# Patient Record
Sex: Female | Born: 1965
Health system: Southern US, Community
[De-identification: ages and names within clinical notes are randomized; demographics above are authoritative.]

## PROBLEM LIST (undated history)

## (undated) DIAGNOSIS — E785 Hyperlipidemia, unspecified: Secondary | ICD-10-CM

## (undated) DIAGNOSIS — F32A Depression, unspecified: Secondary | ICD-10-CM

## (undated) DIAGNOSIS — I10 Essential (primary) hypertension: Secondary | ICD-10-CM

## (undated) DIAGNOSIS — F419 Anxiety disorder, unspecified: Secondary | ICD-10-CM

## (undated) DIAGNOSIS — F329 Major depressive disorder, single episode, unspecified: Secondary | ICD-10-CM

## (undated) HISTORY — DX: Essential (primary) hypertension: I10

## (undated) HISTORY — DX: Hyperlipidemia, unspecified: E78.5

## (undated) HISTORY — PX: PATENT DUCTUS ARTERIOUS REPAIR: SHX269

## (undated) HISTORY — PX: MASTECTOMY: SHX3

## (undated) HISTORY — PX: APPENDECTOMY: SHX54

## (undated) HISTORY — PX: COLON RESECTION: SHX5231

## (undated) HISTORY — PX: CHOLECYSTECTOMY: SHX55

---
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
## 1993-07-26 HISTORY — PX: COLON SURGERY: SHX602

## 1999-01-15 ENCOUNTER — Other Ambulatory Visit: Admission: RE | Admit: 1999-01-15 | Discharge: 1999-01-15 | Payer: Self-pay | Admitting: Gynecology

## 2001-01-08 ENCOUNTER — Emergency Department (HOSPITAL_COMMUNITY): Admission: EM | Admit: 2001-01-08 | Discharge: 2001-01-08 | Payer: Self-pay

## 2001-01-08 ENCOUNTER — Encounter: Payer: Self-pay | Admitting: Emergency Medicine

## 2001-11-14 ENCOUNTER — Other Ambulatory Visit: Admission: RE | Admit: 2001-11-14 | Discharge: 2001-11-14 | Payer: Self-pay | Admitting: Obstetrics and Gynecology

## 2003-08-13 ENCOUNTER — Other Ambulatory Visit: Admission: RE | Admit: 2003-08-13 | Discharge: 2003-08-13 | Payer: Self-pay | Admitting: Gynecology

## 2003-11-28 ENCOUNTER — Emergency Department (HOSPITAL_COMMUNITY): Admission: EM | Admit: 2003-11-28 | Discharge: 2003-11-29 | Payer: Self-pay | Admitting: Emergency Medicine

## 2005-09-09 ENCOUNTER — Other Ambulatory Visit: Admission: RE | Admit: 2005-09-09 | Discharge: 2005-09-09 | Payer: Self-pay | Admitting: Gynecology

## 2007-01-26 ENCOUNTER — Inpatient Hospital Stay (HOSPITAL_COMMUNITY): Admission: AD | Admit: 2007-01-26 | Discharge: 2007-01-30 | Payer: Self-pay | Admitting: Obstetrics and Gynecology

## 2007-12-25 ENCOUNTER — Emergency Department (HOSPITAL_COMMUNITY): Admission: EM | Admit: 2007-12-25 | Discharge: 2007-12-25 | Payer: Self-pay | Admitting: Emergency Medicine

## 2010-04-23 ENCOUNTER — Inpatient Hospital Stay (HOSPITAL_COMMUNITY): Admission: EM | Admit: 2010-04-23 | Discharge: 2010-04-27 | Payer: Self-pay | Admitting: Emergency Medicine

## 2010-04-24 ENCOUNTER — Encounter (INDEPENDENT_AMBULATORY_CARE_PROVIDER_SITE_OTHER): Payer: Self-pay

## 2010-10-07 LAB — CBC
HCT: 34.1 % — ABNORMAL LOW (ref 36.0–46.0)
Hemoglobin: 10.2 g/dL — ABNORMAL LOW (ref 12.0–15.0)
Hemoglobin: 11.1 g/dL — ABNORMAL LOW (ref 12.0–15.0)
MCV: 99.1 fL (ref 78.0–100.0)
MCV: 99.7 fL (ref 78.0–100.0)
Platelets: 254 10*3/uL (ref 150–400)
RBC: 3.18 MIL/uL — ABNORMAL LOW (ref 3.87–5.11)
RBC: 3.44 MIL/uL — ABNORMAL LOW (ref 3.87–5.11)
WBC: 12.2 10*3/uL — ABNORMAL HIGH (ref 4.0–10.5)
WBC: 9.2 10*3/uL (ref 4.0–10.5)

## 2010-10-07 LAB — COMPREHENSIVE METABOLIC PANEL
ALT: 299 U/L — ABNORMAL HIGH (ref 0–35)
ALT: 464 U/L — ABNORMAL HIGH (ref 0–35)
AST: 138 U/L — ABNORMAL HIGH (ref 0–37)
Albumin: 3 g/dL — ABNORMAL LOW (ref 3.5–5.2)
Alkaline Phosphatase: 111 U/L (ref 39–117)
Alkaline Phosphatase: 95 U/L (ref 39–117)
BUN: <1 mg/dL — ABNORMAL LOW (ref 6–23)
CO2: 30 mEq/L (ref 19–32)
CO2: 33 mEq/L — ABNORMAL HIGH (ref 19–32)
Chloride: 98 mEq/L (ref 96–112)
Creatinine, Ser: 0.62 mg/dL (ref 0.4–1.2)
GFR calc non Af Amer: >60 mL/min (ref >60)
Glucose, Bld: 131 mg/dL — ABNORMAL HIGH (ref 70–99)
Potassium: 3.2 mEq/L — ABNORMAL LOW (ref 3.5–5.1)
Potassium: 3.5 mEq/L (ref 3.5–5.1)
Sodium: 136 mEq/L (ref 135–145)
Total Bilirubin: 0.7 mg/dL (ref 0.3–1.2)
Total Bilirubin: 0.9 mg/dL (ref 0.3–1.2)
Total Protein: 6.7 g/dL (ref 6.0–8.3)

## 2010-10-07 LAB — URINE CULTURE
Colony Count: NO GROWTH
Culture  Setup Time: 201110011737
Culture: NO GROWTH

## 2010-10-07 LAB — URINALYSIS, MICROSCOPIC ONLY
Glucose, UA: NEGATIVE mg/dL
Nitrite: NEGATIVE
Protein, ur: NEGATIVE mg/dL
Specific Gravity, Urine: 1.002 — ABNORMAL LOW (ref 1.005–1.030)
Urobilinogen, UA: 0.2 mg/dL (ref 0.0–1.0)
pH: 6 (ref 5.0–8.0)

## 2010-10-08 LAB — BASIC METABOLIC PANEL
Calcium: 8.3 mg/dL — ABNORMAL LOW (ref 8.4–10.5)
Chloride: 101 mEq/L (ref 96–112)
GFR calc non Af Amer: >60 mL/min (ref >60)
Sodium: 139 mEq/L (ref 135–145)

## 2010-10-08 LAB — DIFFERENTIAL
Eosinophils Absolute: 0 10*3/uL (ref 0.0–0.7)
Monocytes Absolute: 0.7 10*3/uL (ref 0.1–1.0)
Monocytes Relative: 5 % (ref 3–12)
Neutro Abs: 11.4 10*3/uL — ABNORMAL HIGH (ref 1.7–7.7)
Neutrophils Relative %: 87 % — ABNORMAL HIGH (ref 43–77)

## 2010-10-08 LAB — URINALYSIS, ROUTINE W REFLEX MICROSCOPIC
Hgb urine dipstick: NEGATIVE
Nitrite: NEGATIVE
pH: 7.5 (ref 5.0–8.0)

## 2010-10-08 LAB — POCT PREGNANCY, URINE: Preg Test, Ur: NEGATIVE

## 2010-10-08 LAB — HEPATIC FUNCTION PANEL
ALT: 864 U/L — ABNORMAL HIGH (ref 0–35)
Albumin: 3.5 g/dL (ref 3.5–5.2)
Bilirubin, Direct: 0.7 mg/dL — ABNORMAL HIGH (ref 0.0–0.3)
Indirect Bilirubin: 1.3 mg/dL — ABNORMAL HIGH (ref 0.3–0.9)
Total Bilirubin: 2 mg/dL — ABNORMAL HIGH (ref 0.3–1.2)

## 2010-10-08 LAB — URINE MICROSCOPIC-ADD ON

## 2010-10-08 LAB — CBC
Platelets: 307 10*3/uL (ref 150–400)
Platelets: 311 10*3/uL (ref 150–400)
RDW: 12.6 % (ref 11.5–15.5)
WBC: 11.1 10*3/uL — ABNORMAL HIGH (ref 4.0–10.5)

## 2010-10-08 LAB — LIPASE, BLOOD: Lipase: 33 U/L (ref 11–59)

## 2010-10-08 LAB — COMPREHENSIVE METABOLIC PANEL
ALT: 689 U/L — ABNORMAL HIGH (ref 0–35)
Alkaline Phosphatase: 118 U/L — ABNORMAL HIGH (ref 39–117)
BUN: 11 mg/dL (ref 6–23)
CO2: 27 mEq/L (ref 19–32)
GFR calc Af Amer: >60 mL/min (ref >60)
GFR calc non Af Amer: >60 mL/min (ref >60)
Potassium: 3.7 mEq/L (ref 3.5–5.1)
Sodium: 137 mEq/L (ref 135–145)
Total Protein: 7.6 g/dL (ref 6.0–8.3)

## 2010-10-08 LAB — GLUCOSE, CAPILLARY

## 2011-05-07 ENCOUNTER — Encounter (INDEPENDENT_AMBULATORY_CARE_PROVIDER_SITE_OTHER): Payer: Self-pay | Admitting: Surgery

## 2011-05-07 ENCOUNTER — Other Ambulatory Visit (INDEPENDENT_AMBULATORY_CARE_PROVIDER_SITE_OTHER): Payer: Self-pay | Admitting: General Surgery

## 2011-05-07 ENCOUNTER — Ambulatory Visit (INDEPENDENT_AMBULATORY_CARE_PROVIDER_SITE_OTHER): Payer: BC Managed Care – PPO | Admitting: Surgery

## 2011-05-07 DIAGNOSIS — K529 Noninfective gastroenteritis and colitis, unspecified: Secondary | ICD-10-CM

## 2011-05-07 DIAGNOSIS — R109 Unspecified abdominal pain: Secondary | ICD-10-CM

## 2011-05-07 NOTE — Progress Notes (Signed)
Carla Torres had a right hemicolectomy by Joanne Gavel several years ago for benign inflammation of the ileocecal region. It was not clear whether this was appendicitis but she said that he was not inflammatory bowel disease. She was seen a [place] last year when Dr. Gaynelle Adu did a open cholecystectomy on her with cholangiogram. This week she began having some abdominal pain and was seen by Herb Grays who ordered a CT scan at [facility]. This revealed a short segment of small bowel in the right lower quadrant with wall thickening and peri-mesenteric stranding. She was placed on Cipro and Flagyl. Today her pain is not that bad and exam is not remarkable.  It sounds like she may have an inflammatory bowel condition and needs to be seen by a gastroenterologist for colonoscopy and biopsy. We will make an appointment with [facility] to see her regarding this.  In addition as noted a smooth margin a left lung base nodule and followup CT scan with contrast is recommended. This is noted on Dr. Alda Berthold copy and  will defer to her on that.  Plan refer to [facility] for colonoscopy to look for evidence of inflammatory bowel disease.

## 2011-05-11 LAB — COMPREHENSIVE METABOLIC PANEL
ALT: 23
AST: 22
Albumin: 2.4 — ABNORMAL LOW
Alkaline Phosphatase: 110
CO2: 26
Calcium: 8.7
Creatinine, Ser: 0.49
GFR calc Af Amer: >60
GFR calc non Af Amer: >60
Potassium: 3.4 — ABNORMAL LOW
Sodium: 139
Total Protein: 6
Total Protein: 6.4

## 2011-05-11 LAB — CBC
Hemoglobin: 10 — ABNORMAL LOW
MCHC: 33.2
MCV: 90.1
Platelets: 311
Platelets: 340
Platelets: 373
RBC: 4.07
RDW: 13.1
RDW: 13.2
RDW: 13.3

## 2011-05-11 LAB — URINALYSIS, ROUTINE W REFLEX MICROSCOPIC
Bilirubin Urine: NEGATIVE
Glucose, UA: NEGATIVE
Leukocytes, UA: NEGATIVE
Nitrite: NEGATIVE
Specific Gravity, Urine: 1.02
Specific Gravity, Urine: <1.005 — ABNORMAL LOW
Urobilinogen, UA: 0.2
pH: 6

## 2011-05-11 LAB — URINE MICROSCOPIC-ADD ON: Crystals: NONE SEEN — AB

## 2011-05-11 LAB — RPR: RPR Ser Ql: NONREACTIVE

## 2011-05-11 LAB — DIFFERENTIAL
Basophils Relative: 0
Eosinophils Absolute: 0.1
Eosinophils Relative: 0
Metamyelocytes Relative: 0
Monocytes Absolute: 0.3
Monocytes Relative: 3
Monocytes Relative: 6
Neutro Abs: 8.3 — ABNORMAL HIGH
Neutrophils Relative %: 81 — ABNORMAL HIGH
nRBC: 0

## 2012-11-20 ENCOUNTER — Other Ambulatory Visit: Payer: Self-pay | Admitting: Gynecology

## 2014-01-07 ENCOUNTER — Other Ambulatory Visit: Payer: Self-pay | Admitting: Gynecology

## 2014-01-08 LAB — CYTOLOGY - PAP

## 2015-09-15 ENCOUNTER — Encounter (HOSPITAL_COMMUNITY): Payer: Self-pay | Admitting: Emergency Medicine

## 2015-09-15 ENCOUNTER — Emergency Department (HOSPITAL_COMMUNITY)
Admission: EM | Admit: 2015-09-15 | Discharge: 2015-09-16 | Disposition: A | Discharge status: Other Acute Inpatient Hospital | Payer: Self-pay

## 2015-09-15 DIAGNOSIS — R45850 Homicidal ideations: Secondary | ICD-10-CM | POA: Insufficient documentation

## 2015-09-15 DIAGNOSIS — R45851 Suicidal ideations: Secondary | ICD-10-CM | POA: Insufficient documentation

## 2015-09-15 DIAGNOSIS — R4781 Slurred speech: Secondary | ICD-10-CM | POA: Insufficient documentation

## 2015-09-15 DIAGNOSIS — F329 Major depressive disorder, single episode, unspecified: Secondary | ICD-10-CM | POA: Insufficient documentation

## 2015-09-15 DIAGNOSIS — T424X2A Poisoning by benzodiazepines, intentional self-harm, initial encounter: Secondary | ICD-10-CM | POA: Insufficient documentation

## 2015-09-15 DIAGNOSIS — F919 Conduct disorder, unspecified: Secondary | ICD-10-CM | POA: Insufficient documentation

## 2015-09-15 DIAGNOSIS — I10 Essential (primary) hypertension: Secondary | ICD-10-CM | POA: Insufficient documentation

## 2015-09-15 DIAGNOSIS — F419 Anxiety disorder, unspecified: Secondary | ICD-10-CM | POA: Insufficient documentation

## 2015-09-15 DIAGNOSIS — X58XXXA Exposure to other specified factors, initial encounter: Secondary | ICD-10-CM | POA: Insufficient documentation

## 2015-09-15 DIAGNOSIS — Y998 Other external cause status: Secondary | ICD-10-CM | POA: Insufficient documentation

## 2015-09-15 DIAGNOSIS — R451 Restlessness and agitation: Secondary | ICD-10-CM | POA: Insufficient documentation

## 2015-09-15 DIAGNOSIS — F17210 Nicotine dependence, cigarettes, uncomplicated: Secondary | ICD-10-CM | POA: Insufficient documentation

## 2015-09-15 DIAGNOSIS — Y9289 Other specified places as the place of occurrence of the external cause: Secondary | ICD-10-CM | POA: Insufficient documentation

## 2015-09-15 DIAGNOSIS — Y9389 Activity, other specified: Secondary | ICD-10-CM | POA: Insufficient documentation

## 2015-09-15 DIAGNOSIS — Z8639 Personal history of other endocrine, nutritional and metabolic disease: Secondary | ICD-10-CM | POA: Insufficient documentation

## 2015-09-15 HISTORY — DX: Anxiety disorder, unspecified: F41.9

## 2015-09-15 HISTORY — DX: Major depressive disorder, single episode, unspecified: F32.9

## 2015-09-15 HISTORY — DX: Depression, unspecified: F32.A

## 2015-09-15 LAB — COMPREHENSIVE METABOLIC PANEL
ALT: 31 U/L (ref 14–54)
ANION GAP: 9 (ref 5–15)
AST: 23 U/L (ref 15–41)
Albumin: 4.1 g/dL (ref 3.5–5.0)
Alkaline Phosphatase: 76 U/L (ref 38–126)
BUN: 12 mg/dL (ref 6–20)
CHLORIDE: 103 mmol/L (ref 101–111)
CO2: 26 mmol/L (ref 22–32)
CREATININE: 0.75 mg/dL (ref 0.44–1.00)
Calcium: 8.9 mg/dL (ref 8.9–10.3)
Glucose, Bld: 112 mg/dL — ABNORMAL HIGH (ref 65–99)
POTASSIUM: 3.8 mmol/L (ref 3.5–5.1)
SODIUM: 138 mmol/L (ref 135–145)
Total Bilirubin: 0.4 mg/dL (ref 0.3–1.2)
Total Protein: 7.9 g/dL (ref 6.5–8.1)

## 2015-09-15 LAB — CBC WITH DIFFERENTIAL/PLATELET
BASOS ABS: 0 10*3/uL (ref 0.0–0.1)
BASOS PCT: 0 %
EOS ABS: 0.4 10*3/uL (ref 0.0–0.7)
Eosinophils Relative: 4 %
HCT: 42.3 % (ref 36.0–46.0)
HEMOGLOBIN: 14.4 g/dL (ref 12.0–15.0)
Lymphocytes Relative: 30 %
Lymphs Abs: 3 10*3/uL (ref 0.7–4.0)
MCH: 32.5 pg (ref 26.0–34.0)
MCHC: 34 g/dL (ref 30.0–36.0)
MCV: 95.5 fL (ref 78.0–100.0)
MONOS PCT: 7 %
Monocytes Absolute: 0.7 10*3/uL (ref 0.1–1.0)
NEUTROS ABS: 5.8 10*3/uL (ref 1.7–7.7)
NEUTROS PCT: 59 %
Platelets: 285 10*3/uL (ref 150–400)
RBC: 4.43 MIL/uL (ref 3.87–5.11)
RDW: 12.9 % (ref 11.5–15.5)
WBC: 10 10*3/uL (ref 4.0–10.5)

## 2015-09-15 LAB — SALICYLATE LEVEL

## 2015-09-15 LAB — ETHANOL

## 2015-09-15 LAB — ACETAMINOPHEN LEVEL

## 2015-09-15 NOTE — ED Notes (Signed)
Patient ambulatory to restroom with assisance, clean catch instructions given and advised pt to bring specimen back to room as well.

## 2015-09-15 NOTE — ED Notes (Signed)
MD speaking with husband, husband states pt will make make comment in front of 49 year old daughter that she will end this. Daughter was present tonight when husband was taking pills from pt mouth.

## 2015-09-15 NOTE — ED Notes (Signed)
Assumed care of patient at this time. Sitter at the bedside at this time. Labs and urine ordered. Pt changed into paper scrubs. Belongings searched and place in belongings bag and labels and secured in nursing station. 

## 2015-09-15 NOTE — ED Notes (Signed)
Husband states he did a finger sweep and removed 20 pills from pt mouth when at home. Pt told husband she swollen 20 pills.

## 2015-09-15 NOTE — ED Provider Notes (Addendum)
CSN: [id number]     Arrival date & time 09/15/15  2227 History  By signing my name below, I, Carla Torres, attest that this documentation has been prepared under the direction and in the presence of Carla Albe, MD at 2305. Electronically Signed: Linus Torres, ED Scribe. 09/16/2015. 11:05 PM.   Chief Complaint  Patient presents with  . Drug Overdose   The history is provided by the patient. No language interpreter was used.   HPI Comments: Carla Torres is a 50 y.o. female with a PMHx of HTN, HLD, depression and anxiety who presents to the Emergency Department for an evaluation of a possible drug overdose, PTA.[name] Pt reports she is in "withdrawal" after she stopped taking her antidepressan for the past 4 days. Pt reports she feels as if "shock waves are going through her brain".  Pt stopped taking "all her medication because she thinks she is better with out them. Pt states she took 20 Klonopin prescribed by Dr. Yehuda Budd at 8:00 PM, 3 hours PTA to "go to sleep and take away the pain". She states her Husband caught pt taking the pills and called 911. Pt denies any previous psychiatric admissions. Pt last saw a psychiatrist "many years ago". Pt denies any previous suicidal attempts.  Patient states that she has been crying and she can't deal with anything. She states this morning and tonight she had extreme episodes where she was screaming.  As per husband, pt has been trying to stop antidepressants for the past 5 days She has been trying to wean off of xanax, blood pressure medication and several other medications. Husband reports that pt has had suicidal ideations in the past. He reports that she has threatened to kill him and herself and threatened to leave in the car tonight. She has made statements like "I can't take it anymore" and "I am going to end it" to her 53 year old daughter. Pt lost her job 3 years ago and she can't seem to get another job or get over losing that job. Husband states that he and  the pt are separated but the pt rents a room from his since she has no where else to go.  He said he was going to go to the magistrate's office this morning to fill out commitment papers on her however her mother would not allow him to.  PCP None  Past Medical History  Diagnosis Date  . Hypertension   . Hyperlipidemia   . Depression   . Anxiety    Past Surgical History  Procedure Laterality Date  . Exploration post operative open heart  1969  . Cholecystectomy    . Colon surgery  1995   No family history on file. Social History  Substance Use Topics  . Smoking status: Current Every Day Smoker -- 0.25 packs/day    Types: Cigarettes  . Smokeless tobacco: None  . Alcohol Use: Yes     Comment: 2x week  smokes 1/2 ppd Unemployed Lives with spouse but they are separated Lives with 14 yo daughter  OB History    No data available     Review of Systems  Constitutional: Negative for fever and chills.  HENT: Negative for congestion and rhinorrhea.   Eyes: Negative for redness and visual disturbance.  Respiratory: Negative for shortness of breath and wheezing.   Cardiovascular: Negative for chest pain and palpitations.  Gastrointestinal: Negative for nausea and vomiting.  Genitourinary: Negative for dysuria and urgency.  Musculoskeletal: Negative for myalgias  and arthralgias.  Skin: Negative for pallor and wound.  Neurological: Negative for dizziness and headaches.  Psychiatric/Behavioral: Positive for suicidal ideas, behavioral problems, self-injury and agitation.  All other systems reviewed and are negative.  Allergies  Codeine  Home Medications   Prior to Admission medications   Medication Sig Start Date End Date Taking? Authorizing Provider  spironolactone (ALDACTONE) 25 MG tablet  04/09/11  Yes Historical Provider, MD  cyclobenzaprine (FLEXERIL) 10 MG tablet  02/01/11   Historical Provider, MD  fluconazole (DIFLUCAN) 150 MG tablet  05/04/11   Historical Provider, MD   FLUoxetine (PROZAC) 20 MG capsule  05/06/11   Historical Provider, MD  fluticasone Aleda Grana) 50 MCG/ACT nasal spray  04/16/11   Historical Provider, MD  metroNIDAZOLE (FLAGYL) 500 MG tablet  05/04/11   Historical Provider, MD  ondansetron (ZOFRAN) 8 MG tablet  05/04/11   Historical Provider, MD   BP 123/79 mmHg  Pulse 76  Temp(Src) 98.2 F (36.8 C) (Oral)  Resp 18  Ht 5\' 5"  (1.651 m)  Wt 175 lb (79.379 kg)  BMI 29.12 kg/m2  SpO2 95%  LMP 08/15/2015  Vital signs normal     Physical Exam  Constitutional: She is oriented to person, place, and time. She appears well-developed and well-nourished.  Non-toxic appearance. She does not appear ill. No distress.  HENT:  Head: Normocephalic and atraumatic.  Right Ear: External ear normal.  Left Ear: External ear normal.  Nose: Nose normal. No mucosal edema or rhinorrhea.  Mouth/Throat: Oropharynx is clear and moist and mucous membranes are normal. No dental abscesses or uvula swelling.  Eyes: Conjunctivae and EOM are normal. Pupils are equal, round, and reactive to light.  Neck: Normal range of motion and full passive range of motion without pain. Neck supple.  Cardiovascular: Normal rate, regular rhythm and normal heart sounds.  Exam reveals no gallop and no friction rub.   No murmur heard. Pulmonary/Chest: Effort normal and breath sounds normal. No respiratory distress. She has no wheezes. She has no rhonchi. She has no rales. She exhibits no tenderness and no crepitus.  Abdominal: Soft. Normal appearance and bowel sounds are normal. She exhibits no distension. There is no tenderness. There is no rebound and no guarding.  Musculoskeletal: Normal range of motion. She exhibits no edema or tenderness.  Moves all extremities well.   Neurological: She is alert and oriented to person, place, and time. She has normal strength. No cranial nerve deficit.  Skin: Skin is warm, dry and intact. No rash noted. No erythema. No pallor.  Psychiatric: Her  affect is angry and labile. She is aggressive.  Speech is slightly slurred  Nursing note and vitals reviewed.   ED Course  Procedures   Medications  nicotine (NICODERM CQ - dosed in mg/24 hours) patch 21 mg (not administered)  LORazepam (ATIVAN) tablet 1 mg (1 mg Oral Given 09/16/15 0118)  acetaminophen (TYLENOL) tablet 650 mg (not administered)  ibuprofen (ADVIL,MOTRIN) tablet 600 mg (not administered)  zolpidem (AMBIEN) tablet 10 mg (10 mg Oral Given 09/16/15 0118)  nicotine (NICODERM CQ - dosed in mg/24 hours) patch 21 mg (21 mg Transdermal Patch Applied 09/16/15 0118)  ondansetron (ZOFRAN) tablet 4 mg (not administered)  alum & mag hydroxide-simeth (MAALOX/MYLANTA) 200-200-20 MG/5ML suspension 30 mL (not administered)     DIAGNOSTIC STUDIES: Oxygen Saturation is 95% on room air, normal by my interpretation.    COORDINATION OF CARE: 11:05 PM Will order blood work, urinalysis and drug screen. Discussed treatment plan with pt at  bedside and pt agreed to plan. I have explained to the patient that as long as she stays and is cooperative that she can stay voluntarily however if she tries to leave I will do commitment papers on her so she will finish her psychiatric evaluation.  2:30 AM Nursing staff gave the patient her Ambien and then she was unable to cooperate for her TSS evaluation.  06:00 Mary, TSS, has evaluated patient and talked to their PA, states patient meets inpatient admission criteria.  IVC papers filled out by me, but not sent to the magistrate yet,  incase patient refuses to be admitted.   06:30 Mary, TSS, states patient needs inpatient treatment, when she asked her if she would sign voluntarily she said she wanted to go home. I asked the patient if she would go voluntarily and she said, no, she wanted to go home. I have sent the IVC papers to the magistrate.   06:34 nurse reports her BP is in the 90's. She was given 1 liter of NS bolus.   Review of the [place] shows no entries in the past 6 months. Husband brought her pill bottles, she was prescribed Lexapro # 120 tablets March 2016 with refills, and Klonopin May 2016 #180 tablets.   Results for orders placed or performed during the hospital encounter of 09/15/15  Comprehensive metabolic panel  Result Value Ref Range   Sodium 138 135 - 145 mmol/L   Potassium 3.8 3.5 - 5.1 mmol/L   Chloride 103 101 - 111 mmol/L   CO2 26 22 - 32 mmol/L   Glucose, Bld 112 (H) 65 - 99 mg/dL   BUN 12 6 - 20 mg/dL   Creatinine, Ser 1.61 0.44 - 1.00 mg/dL   Calcium 8.9 8.9 - 09.6 mg/dL   Total Protein 7.9 6.5 - 8.1 g/dL   Albumin 4.1 3.5 - 5.0 g/dL   AST 23 15 - 41 U/L   ALT 31 14 - 54 U/L   Alkaline Phosphatase 76 38 - 126 U/L   Total Bilirubin 0.4 0.3 - 1.2 mg/dL   GFR calc non Af Amer >60 >60 mL/min   GFR calc Af Amer >60 >60 mL/min   Anion gap 9 5 - 15  Ethanol  Result Value Ref Range   Alcohol, Ethyl (B) <5 <5 mg/dL  Acetaminophen level  Result Value Ref Range   Acetaminophen (Tylenol), Serum <10 (L) 10 - 30 ug/mL  Salicylate level  Result Value Ref Range   Salicylate Lvl <4.0 2.8 - 30.0 mg/dL  CBC with Differential  Result Value Ref Range   WBC 10.0 4.0 - 10.5 K/uL   RBC 4.43 3.87 - 5.11 MIL/uL   Hemoglobin 14.4 12.0 - 15.0 g/dL   HCT 04.5 40.9 - 81.1 %   MCV 95.5 78.0 - 100.0 fL   MCH 32.5 26.0 - 34.0 pg   MCHC 34.0 30.0 - 36.0 g/dL   RDW 91.4 78.2 - 95.6 %   Platelets 285 150 - 400 K/uL   Neutrophils Relative % 59 %   Neutro Abs 5.8 1.7 - 7.7 K/uL   Lymphocytes Relative 30 %   Lymphs Abs 3.0 0.7 - 4.0 K/uL   Monocytes Relative 7 %   Monocytes Absolute 0.7 0.1 - 1.0 K/uL   Eosinophils Relative 4 %   Eosinophils Absolute 0.4 0.0 - 0.7 K/uL   Basophils Relative 0 %   Basophils Absolute 0.0 0.0 - 0.1 K/uL  Urinalysis, Routine w reflex microscopic  Result Value Ref Range   Color, Urine YELLOW YELLOW   APPearance HAZY (A) CLEAR   Specific Gravity, Urine 1.010 1.005 - 1.030   pH  6.0 5.0 - 8.0   Glucose, UA NEGATIVE NEGATIVE mg/dL   Hgb urine dipstick NEGATIVE NEGATIVE   Bilirubin Urine NEGATIVE NEGATIVE   Ketones, ur NEGATIVE NEGATIVE mg/dL   Protein, ur NEGATIVE NEGATIVE mg/dL   Nitrite NEGATIVE NEGATIVE   Leukocytes, UA SMALL (A) NEGATIVE  Urine rapid drug screen (hosp performed)  Result Value Ref Range   Opiates NONE DETECTED NONE DETECTED   Cocaine NONE DETECTED NONE DETECTED   Benzodiazepines NONE DETECTED NONE DETECTED   Amphetamines NONE DETECTED NONE DETECTED   Tetrahydrocannabinol NONE DETECTED NONE DETECTED   Barbiturates NONE DETECTED NONE DETECTED  Urine microscopic-add on  Result Value Ref Range   Squamous Epithelial / LPF TOO NUMEROUS TO COUNT (A) NONE SEEN   WBC, UA 6-30 0 - 5 WBC/hpf   RBC / HPF NONE SEEN 0 - 5 RBC/hpf   Bacteria, UA MANY (A) NONE SEEN   Laboratory interpretation all normal except contaminated urinalysis     MDM   Final diagnoses:  Overdose of benzodiazepine, intentional self-harm, initial encounter (HCC)  Suicidal ideations  Homicidal ideation    Plan inpatient psychiatric admission  Carla Albe, MD, FACEP   I personally performed the services described in this documentation, which was scribed in my presence. The recorded information has been reviewed and considered.  Carla Albe, MD, Concha Pyo, MD 09/16/15 [id number]  Carla Albe, MD 09/16/15 [phone]

## 2015-09-15 NOTE — ED Notes (Signed)
MD at the bedside, husband waiting in the hallway

## 2015-09-15 NOTE — ED Notes (Signed)
Patient assisted to restroom to obtain urine specimen. Patient states she has a headache, throat is sore and has a cold also.

## 2015-09-15 NOTE — ED Notes (Signed)
Pt reports that she stopped her antidepressants three days ago because her physician is no longer in practice and she wants to be off of all pills- She took Klonopin 0.5mg   #20 approximately 1 hour ago to help with her withdrawal- when pressed she admits to trying to hurt herself.  She has no psychiatrist, and reports her physician was /Dr Yehuda Budd, who is no longer in practice and she has gotten no new physician and she has no scripts for new meds

## 2015-09-16 ENCOUNTER — Inpatient Hospital Stay
Admission: EM | Admit: 2015-09-16 | Discharge: 2015-09-18 | DRG: 885 | Disposition: A | Discharge status: Home / Self Care | Payer: No Typology Code available for payment source | Source: Intra-hospital | Attending: Psychiatry | Admitting: Psychiatry

## 2015-09-16 DIAGNOSIS — R45851 Suicidal ideations: Secondary | ICD-10-CM | POA: Diagnosis present

## 2015-09-16 DIAGNOSIS — Z886 Allergy status to analgesic agent status: Secondary | ICD-10-CM

## 2015-09-16 DIAGNOSIS — Z9889 Other specified postprocedural states: Secondary | ICD-10-CM | POA: Diagnosis not present

## 2015-09-16 DIAGNOSIS — F17200 Nicotine dependence, unspecified, uncomplicated: Secondary | ICD-10-CM | POA: Diagnosis present

## 2015-09-16 DIAGNOSIS — N390 Urinary tract infection, site not specified: Secondary | ICD-10-CM | POA: Diagnosis present

## 2015-09-16 DIAGNOSIS — F17210 Nicotine dependence, cigarettes, uncomplicated: Secondary | ICD-10-CM | POA: Diagnosis present

## 2015-09-16 DIAGNOSIS — Z818 Family history of other mental and behavioral disorders: Secondary | ICD-10-CM | POA: Diagnosis not present

## 2015-09-16 DIAGNOSIS — Z9049 Acquired absence of other specified parts of digestive tract: Secondary | ICD-10-CM | POA: Diagnosis not present

## 2015-09-16 DIAGNOSIS — F3181 Bipolar II disorder: Principal | ICD-10-CM | POA: Diagnosis present

## 2015-09-16 DIAGNOSIS — Z915 Personal history of self-harm: Secondary | ICD-10-CM

## 2015-09-16 DIAGNOSIS — I10 Essential (primary) hypertension: Secondary | ICD-10-CM | POA: Diagnosis present

## 2015-09-16 DIAGNOSIS — F314 Bipolar disorder, current episode depressed, severe, without psychotic features: Secondary | ICD-10-CM | POA: Diagnosis present

## 2015-09-16 LAB — URINE MICROSCOPIC-ADD ON: RBC / HPF: NONE SEEN RBC/hpf (ref 0–5)

## 2015-09-16 LAB — RAPID URINE DRUG SCREEN, HOSP PERFORMED
AMPHETAMINES: NOT DETECTED
Barbiturates: NOT DETECTED
Benzodiazepines: NOT DETECTED
Cocaine: NOT DETECTED
OPIATES: NOT DETECTED
TETRAHYDROCANNABINOL: NOT DETECTED

## 2015-09-16 LAB — URINALYSIS, ROUTINE W REFLEX MICROSCOPIC
Bilirubin Urine: NEGATIVE
GLUCOSE, UA: NEGATIVE mg/dL
HGB URINE DIPSTICK: NEGATIVE
Ketones, ur: NEGATIVE mg/dL
Nitrite: NEGATIVE
Protein, ur: NEGATIVE mg/dL
SPECIFIC GRAVITY, URINE: 1.01 (ref 1.005–1.030)
pH: 6 (ref 5.0–8.0)

## 2015-09-16 MED ORDER — SODIUM CHLORIDE 0.9 % IV SOLN
Freq: Once | INTRAVENOUS | Status: AC
  Administered 2015-09-16: 07:00:00 via INTRAVENOUS

## 2015-09-16 MED ORDER — PNEUMOCOCCAL VAC POLYVALENT 25 MCG/0.5ML IJ INJ
0.5000 mL | INJECTION | INTRAMUSCULAR | Status: AC
  Administered 2015-09-17: 0.5 mL via INTRAMUSCULAR
  Filled 2015-09-16: qty 0.5

## 2015-09-16 MED ORDER — ONDANSETRON HCL 4 MG PO TABS: 4.0000 mg | ORAL_TABLET | Freq: Three times a day (TID) | ORAL | Status: DC | PRN

## 2015-09-16 MED ORDER — NICOTINE 21 MG/24HR TD PT24
21.0000 mg | MEDICATED_PATCH | Freq: Once | TRANSDERMAL | Status: DC
  Filled 2015-09-16: qty 1

## 2015-09-16 MED ORDER — IBUPROFEN 400 MG PO TABS: 600.0000 mg | ORAL_TABLET | Freq: Three times a day (TID) | ORAL | Status: DC | PRN

## 2015-09-16 MED ORDER — ACETAMINOPHEN 325 MG PO TABS: 650.0000 mg | ORAL_TABLET | Freq: Four times a day (QID) | ORAL | Status: DC | PRN

## 2015-09-16 MED ORDER — INFLUENZA VAC SPLIT QUAD 0.5 ML IM SUSY
0.5000 mL | PREFILLED_SYRINGE | INTRAMUSCULAR | Status: AC
  Administered 2015-09-17: 0.5 mL via INTRAMUSCULAR
  Filled 2015-09-16: qty 0.5

## 2015-09-16 MED ORDER — ALUM & MAG HYDROXIDE-SIMETH 200-200-20 MG/5ML PO SUSP: 30.0000 mL | ORAL | Status: DC | PRN

## 2015-09-16 MED ORDER — TRAZODONE HCL 100 MG PO TABS
100.0000 mg | ORAL_TABLET | Freq: Every evening | ORAL | Status: DC | PRN
  Administered 2015-09-16: 100 mg via ORAL
  Filled 2015-09-16: qty 1

## 2015-09-16 MED ORDER — ACETAMINOPHEN 325 MG PO TABS
650.0000 mg | ORAL_TABLET | ORAL | Status: DC | PRN
Start: 2015-09-16 — End: 2015-09-16

## 2015-09-16 MED ORDER — ZOLPIDEM TARTRATE 5 MG PO TABS
10.0000 mg | ORAL_TABLET | Freq: Every evening | ORAL | Status: DC | PRN
  Administered 2015-09-16: 10 mg via ORAL
  Filled 2015-09-16: qty 2

## 2015-09-16 MED ORDER — NICOTINE 21 MG/24HR TD PT24
21.0000 mg | MEDICATED_PATCH | Freq: Every day | TRANSDERMAL | Status: DC
  Administered 2015-09-16 (×2): 21 mg via TRANSDERMAL
  Filled 2015-09-16: qty 1

## 2015-09-16 MED ORDER — LORAZEPAM 1 MG PO TABS
1.0000 mg | ORAL_TABLET | Freq: Three times a day (TID) | ORAL | Status: DC | PRN
  Administered 2015-09-16 (×2): 1 mg via ORAL
  Filled 2015-09-16 (×2): qty 1

## 2015-09-16 MED ORDER — NICOTINE 21 MG/24HR TD PT24
21.0000 mg | MEDICATED_PATCH | Freq: Every day | TRANSDERMAL | Status: DC
Start: 2015-09-17 — End: 2015-09-18
  Administered 2015-09-17 – 2015-09-18 (×2): 21 mg via TRANSDERMAL
  Filled 2015-09-16 (×2): qty 1

## 2015-09-16 MED ORDER — MAGNESIUM HYDROXIDE 400 MG/5ML PO SUSP: 30.0000 mL | Freq: Every day | ORAL | Status: DC | PRN

## 2015-09-16 NOTE — ED Notes (Addendum)
Pt. crying, does not want inpatient admission. States she just wants to go home.

## 2015-09-16 NOTE — ED Provider Notes (Signed)
Patient accepted to [facility]  BP 118/76 mmHg  Pulse 63  Temp(Src) 98.2 F (36.8 C) (Oral)  Resp 23  Ht  (1.651 m)  Wt 175 lb (79.379 kg)  BMI 29.12 kg/m2  SpO2 95%  LMP 08/15/2015   Glynn Octave, MD 09/16/15 1544

## 2015-09-16 NOTE — ED Notes (Signed)
Called [facility] for transport to [facility].

## 2015-09-16 NOTE — Progress Notes (Signed)
Pt admitted to unit from [facility]. Pt is alert and oriented x4. Pt states that she has been on antidepressants for years and they are "all just crap." Pt stopped taking her antidepressants "cold [place]" for 4-5 days and states that she does not have a desire to start taking them again. Pt reports that her depression symptoms get worse 1-2 weeks before her menses, which she started today. She says, "all I did was cry. I was angry. I didn't want to live anymore so I took a handful of Klonopin." and "I'm not happy in my home life, I'm never happy". Pt was laid off from her job of 9 years is 2014 and has been unable to find work since. She currently lives with her 3 year old daughter and husband, who it appears she is separated from at this time. Pt blames ex-husband for "everything that goes wrong." Pt is tearful during assessment, stating "I can't do anything right for my daughter." Pt denies SI/HI/AVH at this time. Denies pain. Skin assessment performed and no contraband found. Pt has 2 scars on her abdomen from previous surgeries - one is midline and the other is on the right abdomen. Pt has a tattoo on her left lower leg. No wounds or bruises noted. Pt is oriented to the unit. No concerns or complaints at this time. q15 minute safety checks maintained. Pt remains free from harm.

## 2015-09-16 NOTE — ED Notes (Signed)
Report given to Child psychotherapist at [facility] behavioral health.

## 2015-09-16 NOTE — ED Notes (Signed)
Pt sleeping, unable to do telepsy, reorder in Am

## 2015-09-16 NOTE — Tx Team (Signed)
Initial Interdisciplinary Treatment Plan   PATIENT STRESSORS: Marital or family conflict Medication change or noncompliance Occupational concerns   PATIENT STRENGTHS: Average or above average intelligence Capable of independent living Communication skills General fund of knowledge Supportive family/friends   PROBLEM LIST: Problem List/Patient Goals Date to be addressed Date deferred Reason deferred Estimated date of resolution  Depression 09/16/15     Suicidal ideation 09/16/15           "coping skills that include my daughter" 09/16/15                                    DISCHARGE CRITERIA:  Improved stabilization in mood, thinking, and/or behavior Motivation to continue treatment in a less acute level of care Need for constant or close observation no longer present Reduction of life-threatening or endangering symptoms to within safe limits Verbal commitment to aftercare and medication compliance  PRELIMINARY DISCHARGE PLAN: Attend aftercare/continuing care group Outpatient therapy Participate in family therapy Placement in alternative living arrangements  PATIENT/FAMIILY INVOLVEMENT: This treatment plan has been presented to and reviewed with the patient, Carla Torres, and/or family member.  The patient and family have been given the opportunity to ask questions and make suggestions.  Larson Limones L Parsells 09/16/2015, 8:25 PM

## 2015-09-16 NOTE — ED Notes (Signed)
Patient ambulatory to restroom with assistance

## 2015-09-16 NOTE — ED Notes (Signed)
Pt is restless, moaning, complaining can not rest, pt ambulatory to the bathroom with assistance. meds ordered to be given

## 2015-09-16 NOTE — ED Notes (Signed)
Patient being served IVC papers by [employer].

## 2015-09-16 NOTE — Progress Notes (Signed)
Seeking inpatient psychiatric treatment for pt. Also considered for [facility] and [facility] BH admission upon bed availability.  Referred to: [place]- per Otilio Saber- per [facility]- per Philip Aspen- per Omar Person, MSW, LCSW Clinical Social Work, Disposition  09/16/2015 [phone]

## 2015-09-16 NOTE — ED Notes (Signed)
telepsy in progess

## 2015-09-16 NOTE — [facility] Group Notes (Signed)
[facility] Group Notes:  (Nursing/MHT/Case Management/Adjunct)  Date:  09/16/2015  Time:  11:57 PM  Type of Therapy:  Group Therapy  Participation Level:  Did Not Attend   Summary of Progress/Problems:  Veva Holes 09/16/2015, 11:57 PM

## 2015-09-16 NOTE — BH Assessment (Signed)
Patient has been accepted to [facility].  Accepting physician is Dr. Ardyth Harps.  Attending Physician will be Dr. Jennet Maduro.  Patient has been assigned to room 305, by [facility] Charge Nurse Condon.  Call report to [phone].  Representative/Transfer Coordinator is Dyanne Iha [facility] Staff (Meghan, Social Work/TTS) made aware of acceptance.

## 2015-09-16 NOTE — ED Notes (Signed)
Pt wet, complaining of cough and wetting bed. Bed and pt cleaned.
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
## 2015-09-17 DIAGNOSIS — I10 Essential (primary) hypertension: Secondary | ICD-10-CM | POA: Diagnosis present

## 2015-09-17 DIAGNOSIS — F314 Bipolar disorder, current episode depressed, severe, without psychotic features: Secondary | ICD-10-CM

## 2015-09-17 MED ORDER — FOSFOMYCIN TROMETHAMINE 3 G PO PACK
3.0000 g | PACK | Freq: Once | ORAL | Status: AC
  Administered 2015-09-17: 3 g via ORAL
  Filled 2015-09-17 (×2): qty 3

## 2015-09-17 MED ORDER — CARBAMAZEPINE 200 MG PO TABS
200.0000 mg | ORAL_TABLET | Freq: Two times a day (BID) | ORAL | Status: DC
  Administered 2015-09-17 – 2015-09-18 (×2): 200 mg via ORAL
  Filled 2015-09-17 (×2): qty 1

## 2015-09-17 MED ORDER — SPIRONOLACTONE 25 MG PO TABS
25.0000 mg | ORAL_TABLET | Freq: Every day | ORAL | Status: DC
  Administered 2015-09-17 – 2015-09-18 (×2): 25 mg via ORAL
  Filled 2015-09-17 (×2): qty 1

## 2015-09-17 MED ORDER — TEMAZEPAM 15 MG PO CAPS
15.0000 mg | ORAL_CAPSULE | Freq: Every day | ORAL | Status: DC
  Administered 2015-09-17: 15 mg via ORAL
  Filled 2015-09-17: qty 1

## 2015-09-17 NOTE — Progress Notes (Signed)
Recreation Therapy Notes  INPATIENT RECREATION THERAPY ASSESSMENT  Patient Details Name: Carla Torres MRN: [id number] DOB: 27-May-1966 Today's Date: 09/17/2015  Patient Stressors: Family, Relationship, Death, Friends, Work, Other (Comment) (Family lives out of state - they can be strssful b/c they worry about her; stressful relationship with husband - he drives her crazy; friends have commited suicide in Nov and May of 2016; lack of supportive friends; not been able to get a job;)"Everything"  Coping Skills:   Isolate, Arguments, Exercise, Music, Sports, Other (Comment) (Sleep)  Personal Challenges: Anger, Communication, Concentration, Decision-Making, Expressing Yourself, Problem-Solving, Relationships, Self-Esteem/Confidence, Social Interaction, Stress Management, Trusting Others  Leisure Interests (2+):  Individual - Other (Comment) (Go out to dinner, spend time with daughter)  Awareness of Community Resources:  No  Community Resources:     Current Use:    If no, Barriers?:    Patient Strengths:  Daughter, smile  Patient Identified Areas of Improvement:  Everything  Current Recreation Participation:  Nothing  Patient Goal for Hospitalization:  To get out  [place] of Residence:  [place] of Residence:  [place]   Current [place] (including self-harm):  No  Current HI:  No  Consent to Intern Participation: N/A   Jacquelynn Cree, LRT/CTRS 09/17/2015, 12:31 PM

## 2015-09-17 NOTE — Progress Notes (Signed)
Pt has become less tearful as the evening went on. Pt provided with hygiene supplies and a sandwich tray. Pt requests PRN medication for sleep this evening. Pt does wake up c/o cough. She states that she has had a cold and sore throat for a couple of days. Vital signs checked. Pt encouraged to drink fluids. Pt returned to room to sleep. Continues to deny SI/HI/AVH. q15 minute safety checks maintained. Pt remains free from harm.

## 2015-09-17 NOTE — H&P (Signed)
Psychiatric Admission Assessment Adult  Patient Identification: Carla Torres MRN:  [id number] Date of Evaluation:  09/17/2015 Chief Complaint:  Depression Principal Diagnosis: Bipolar I disorder, most recent episode depressed, severe without psychotic features ([place]) Diagnosis:   Patient Active Problem List   Diagnosis Date Noted  . HTN (hypertension) [I10] 09/17/2015  . Bipolar I disorder, most recent episode depressed, severe without psychotic features ([place]) [F31.4] 09/16/2015  . Tobacco use disorder [F17.200] 09/16/2015   History of Present Illness:  Identifying data. Information was obtained from the patient and the chart. Mrs. Carla Torres has a long history of depression and anxiety. She has been maintained on a combination of Lexapro and Klonopin. 5 days ago she discontinued Lexapro. She reports that she immediately became severely depressed with poor sleep, decreased appetite, anhedonia, feeling of guilt and hopelessness worthlessness, poor energy and concentration, social isolation, crying spells and suicidal thinking. She overdosed on clonazepam taking 25 pills. This was done in front of her husband who called the ambulance. She denies planning suicide and did not believe that the her intention was to go to sleep. She is not at all glad to be alive. She feels hopeless and worthless. She feels that she cannot accomplish anything and is very dissatisfied with her marriage. She's been with her husband for 15 years married for 10. They have an 49-year-old daughter whom she adores but does not see much future. She has not been able to find employment after losing her job several years ago and is forced to live with her husband who is an alcoholic and rather insensitive. In addition to symptoms of depression she experiences infrequent panic attacks for which she was taking Klonopin infrequently. She denies alcohol or illicit substance use. She does report periods of high energy, insomnia, irritability,  hyperactivity, and feeling as if she were on the top of the world. It happens multiple times but does not last more than a day. She then crashes into deep depression. The patient is extremely tearful on the interview. She also use seems somewhat sedated from clonazepam. I have to ask questions several times before I get the right answer.  Psychiatric history. She has never been hospitalized. She never attempted suicide before. She has not tried other medications are Lexapro and clonazepam.  Family psychiatric history. She reports that she is one of 17 grandchildren who all have anxiety and depression. Her cousin recently committed suicide by shooting himself.  Social history. She used to work as a Dance movement psychotherapist and at that time she was able to leave her husband for 9 months and support herself and her daughter. She lost his job unexpectedly and was forced to move back in with her husband. She took flebotomy classes but has not been able to find a job due to lack of experience. Most of her family lives out of state. She has elderly parents living locally. She has no other support. She does not have health insurance.  Total Time spent with patient: 1 hour  Past Psychiatric History: Depression and anxiety.  Is the patient at risk to self? Yes.    Has the patient been a risk to self in the past 6 months? No.  Has the patient been a risk to self within the distant past? No.  Is the patient a risk to others? No.  Has the patient been a risk to others in the past 6 months? No.  Has the patient been a risk to others within the distant past? No.  Prior Inpatient Therapy:   Prior Outpatient Therapy:    Alcohol Screening: 1. How often do you have a drink containing alcohol?: 2 to 3 times a week 2. How many drinks containing alcohol do you have on a typical day when you are drinking?: 1 or 2 3. How often do you have six or more drinks on one occasion?: Never Preliminary Score: 0 4. How often during  the last year have you found that you were not able to stop drinking once you had started?: Never 5. How often during the last year have you failed to do what was normally expected from you becasue of drinking?: Never 6. How often during the last year have you needed a first drink in the morning to get yourself going after a heavy drinking session?: Never 7. How often during the last year have you had a feeling of guilt of remorse after drinking?: Never 8. How often during the last year have you been unable to remember what happened the night before because you had been drinking?: Never 9. Have you or someone else been injured as a result of your drinking?: No 10. Has a relative or friend or a doctor or another health worker been concerned about your drinking or suggested you cut down?: No Alcohol Use Disorder Identification Test Final Score (AUDIT): 3 Brief Intervention: AUDIT score less than 7 or less-screening does not suggest unhealthy drinking-brief intervention not indicated Substance Abuse History in the last 12 months:  No. Consequences of Substance Abuse: NA Previous Psychotropic Medications: Yes  Psychological Evaluations: No  Past Medical History:  Past Medical History  Diagnosis Date  . Hypertension   . Hyperlipidemia   . Depression   . Anxiety     Past Surgical History  Procedure Laterality Date  . Exploration post operative open heart  1969  . Cholecystectomy    . Colon surgery  1995   Family History: History reviewed. No pertinent family history. Family Psychiatric  History: Depression and anxiety. Tobacco Screening: _0 [phone])::1)@ Social History:  History  Alcohol Use  . Yes    Comment: 2x week; 1-2 glasses of wine     History  Drug Use No    Additional Social History:      Pain Medications: denies Prescriptions: see PTA meds Over the Counter: denies History of alcohol / drug use?: No history of alcohol / drug abuse                     Allergies:   Allergies  Allergen Reactions  . Codeine Nausea And Vomiting   Lab Results:  Results for orders placed or performed during the hospital encounter of 09/15/15 (from the past 48 hour(s))  Comprehensive metabolic panel     Status: Abnormal   Collection Time: 09/15/15 10:35 PM  Result Value Ref Range   Sodium 138 135 - 145 mmol/L   Potassium 3.8 3.5 - 5.1 mmol/L   Chloride 103 101 - 111 mmol/L   CO2 26 22 - 32 mmol/L   Glucose, Bld 112 (H) 65 - 99 mg/dL   BUN 12 6 - 20 mg/dL   Creatinine, Ser 0.75 0.44 - 1.00 mg/dL   Calcium 8.9 8.9 - 10.3 mg/dL   Total Protein 7.9 6.5 - 8.1 g/dL   Albumin 4.1 3.5 - 5.0 g/dL   AST 23 15 - 41 U/L   ALT 31 14 - 54 U/L   Alkaline Phosphatase 76 38 - 126 U/L   Total Bilirubin  0.4 0.3 - 1.2 mg/dL   GFR calc non Af Amer >60 >60 mL/min   GFR calc Af Amer >60 >60 mL/min    Comment: (NOTE) The eGFR has been calculated using the CKD EPI equation. This calculation has not been validated in all clinical situations. eGFR's persistently <60 mL/min signify possible Chronic Kidney Disease.    Anion gap 9 5 - 15  Ethanol     Status: None   Collection Time: 09/15/15 10:35 PM  Result Value Ref Range   Alcohol, Ethyl (B) <5 <5 mg/dL    Comment:        LOWEST DETECTABLE LIMIT FOR SERUM ALCOHOL IS 5 mg/dL FOR MEDICAL PURPOSES ONLY   Acetaminophen level     Status: Abnormal   Collection Time: 09/15/15 10:35 PM  Result Value Ref Range   Acetaminophen (Tylenol), Serum <10 (L) 10 - 30 ug/mL    Comment:        THERAPEUTIC CONCENTRATIONS VARY SIGNIFICANTLY. A RANGE OF 10-30 ug/mL MAY BE AN EFFECTIVE CONCENTRATION FOR MANY PATIENTS. HOWEVER, SOME ARE BEST TREATED AT CONCENTRATIONS OUTSIDE THIS RANGE. ACETAMINOPHEN CONCENTRATIONS >150 ug/mL AT 4 HOURS AFTER INGESTION AND >50 ug/mL AT 12 HOURS AFTER INGESTION ARE OFTEN ASSOCIATED WITH TOXIC REACTIONS.   Salicylate level     Status: None   Collection Time: 09/15/15 10:35 PM  Result Value  Ref Range   Salicylate Lvl <7.2 2.8 - 30.0 mg/dL  CBC with Differential     Status: None   Collection Time: 09/15/15 10:35 PM  Result Value Ref Range   WBC 10.0 4.0 - 10.5 K/uL   RBC 4.43 3.87 - 5.11 MIL/uL   Hemoglobin 14.4 12.0 - 15.0 g/dL   HCT 42.3 36.0 - 46.0 %   MCV 95.5 78.0 - 100.0 fL   MCH 32.5 26.0 - 34.0 pg   MCHC 34.0 30.0 - 36.0 g/dL   RDW 12.9 11.5 - 15.5 %   Platelets 285 150 - 400 K/uL   Neutrophils Relative % 59 %   Neutro Abs 5.8 1.7 - 7.7 K/uL   Lymphocytes Relative 30 %   Lymphs Abs 3.0 0.7 - 4.0 K/uL   Monocytes Relative 7 %   Monocytes Absolute 0.7 0.1 - 1.0 K/uL   Eosinophils Relative 4 %   Eosinophils Absolute 0.4 0.0 - 0.7 K/uL   Basophils Relative 0 %   Basophils Absolute 0.0 0.0 - 0.1 K/uL  Urinalysis, Routine w reflex microscopic     Status: Abnormal   Collection Time: 09/15/15 11:45 PM  Result Value Ref Range   Color, Urine YELLOW YELLOW   APPearance HAZY (A) CLEAR   Specific Gravity, Urine 1.010 1.005 - 1.030   pH 6.0 5.0 - 8.0   Glucose, UA NEGATIVE NEGATIVE mg/dL   Hgb urine dipstick NEGATIVE NEGATIVE   Bilirubin Urine NEGATIVE NEGATIVE   Ketones, ur NEGATIVE NEGATIVE mg/dL   Protein, ur NEGATIVE NEGATIVE mg/dL   Nitrite NEGATIVE NEGATIVE   Leukocytes, UA SMALL (A) NEGATIVE  Urine rapid drug screen (hosp performed)     Status: None   Collection Time: 09/15/15 11:45 PM  Result Value Ref Range   Opiates NONE DETECTED NONE DETECTED   Cocaine NONE DETECTED NONE DETECTED   Benzodiazepines NONE DETECTED NONE DETECTED   Amphetamines NONE DETECTED NONE DETECTED   Tetrahydrocannabinol NONE DETECTED NONE DETECTED   Barbiturates NONE DETECTED NONE DETECTED    Comment:        DRUG SCREEN FOR MEDICAL PURPOSES ONLY.  IF CONFIRMATION  IS NEEDED FOR ANY PURPOSE, NOTIFY LAB WITHIN 5 DAYS.        LOWEST DETECTABLE LIMITS FOR URINE DRUG SCREEN Drug Class       Cutoff (ng/mL) Amphetamine      1000 Barbiturate      200 Benzodiazepine    409 Tricyclics       811 Opiates          300 Cocaine          300 THC              50   Urine microscopic-add on     Status: Abnormal   Collection Time: 09/15/15 11:45 PM  Result Value Ref Range   Squamous Epithelial / LPF TOO NUMEROUS TO COUNT (A) NONE SEEN   WBC, UA 6-30 0 - 5 WBC/hpf   RBC / HPF NONE SEEN 0 - 5 RBC/hpf   Bacteria, UA MANY (A) NONE SEEN    Blood Alcohol level:  Lab Results  Component Value Date   ETH <5 91/47/8295    Metabolic Disorder Labs:  No results found for: HGBA1C, MPG No results found for: PROLACTIN No results found for: CHOL, TRIG, HDL, CHOLHDL, VLDL, LDLCALC  Current Medications: Current Facility-Administered Medications  Medication Dose Route Frequency Provider Last Rate Last Dose  . acetaminophen (TYLENOL) tablet 650 mg  650 mg Oral Q6H PRN Hildred Priest, MD      . alum & mag hydroxide-simeth (MAALOX/MYLANTA) 200-200-20 MG/5ML suspension 30 mL  30 mL Oral Q4H PRN Hildred Priest, MD      . carbamazepine (TEGRETOL) tablet 200 mg  200 mg Oral BID AC & HS Amiayah Giebel B Jersee Winiarski, MD      . fosfomycin (MONUROL) packet 3 g  3 g Oral Once Abbe Bula B Broden Holt, MD      . magnesium hydroxide (MILK OF MAGNESIA) suspension 30 mL  30 mL Oral Daily PRN Hildred Priest, MD      . nicotine (NICODERM CQ - dosed in mg/24 hours) patch 21 mg  21 mg Transdermal Daily Hildred Priest, MD   21 mg at 09/17/15 1039  . ondansetron (ZOFRAN) tablet 4 mg  4 mg Oral Q8H PRN Hildred Priest, MD      . spironolactone (ALDACTONE) tablet 25 mg  25 mg Oral Daily Hildred Mollica B Iram Astorino, MD      . traZODone (DESYREL) tablet 100 mg  100 mg Oral QHS PRN Hildred Priest, MD   100 mg at 09/16/15 2153   PTA Medications: Prescriptions prior to admission  Medication Sig Dispense Refill Last Dose  . spironolactone (ALDACTONE) 25 MG tablet Take 25 mg by mouth daily.    09/15/2015 at morning    Musculoskeletal: Strength & Muscle  Tone: within normal limits Gait & Station: normal Patient leans: N/A  Psychiatric Specialty Exam: Physical Exam  Nursing note and vitals reviewed. Constitutional: She is oriented to person, place, and time. She appears well-developed and well-nourished.  HENT:  Head: Atraumatic.  Eyes: Conjunctivae and EOM are normal. Pupils are equal, round, and reactive to light.  Neck: Normal range of motion. Neck supple.  Cardiovascular: Normal rate, regular rhythm and normal heart sounds.   Respiratory: Effort normal and breath sounds normal.  GI: Soft. Bowel sounds are normal.  Musculoskeletal: Normal range of motion.  Neurological: She is alert and oriented to person, place, and time.  Skin: Skin is warm and dry.    Review of Systems  Psychiatric/Behavioral: Positive for depression and suicidal ideas. The patient  is nervous/anxious.   All other systems reviewed and are negative.   Blood pressure 109/57, pulse 81, temperature 97.8 F (36.6 C), temperature source Oral, resp. rate 18, height 5' 5.75" (1.67 m), weight 84.823 kg (187 lb), last menstrual period 09/16/2015, SpO2 98 %.Body mass index is 30.41 kg/(m^2).  ee SRA.                                                  Sleep:  Number of Hours: 6.5     Treatment Plan Summary: Daily contact with patient to assess and evaluate symptoms and progress in treatment and Medication management   Ms. Monaco is a 50 year old female with history of depression and anxiety admitted after suicide by medication overdose in the context of marital conflict.  1. Suicidal ideation. The patient is not at all happy to be alive. She is able to contract for safety in the hospital.  2. Mood and anxiety. She has been maintained on a combination of Lexapro for depression and clonazepam or infrequent panic attacks. She stopped taking Lexapro 5 days ago. She does not want to be on an antidepressant. We discussed the mood stabilizer as the patient  has symptoms suggestive of hypomania. I will start Tegretol.  3. Hypertension. We will continue spironolactone.  4. Smoking. Nicotine patches available.  5. Insomnia. She did not sleep well with trazodone. Will try Restoril tonight.  6. UTI. Will give fosfomycin.  7. Disposition. She will be discharged to home with family. She will follow up with a provider in [facility].  Observation Level/Precautions:  15 minute checks  Laboratory:  CBC Chemistry Profile UDS UA  Psychotherapy:    Medications:    Consultations:    Discharge Concerns:    Estimated LOS:  Other:     I certify that inpatient services furnished can reasonably be expected to improve the patient's condition.    Orson Slick, MD 2/22/201711:28 AM

## 2015-09-17 NOTE — [facility] Group Notes (Signed)
[facility] Group Notes:  (Nursing/MHT/Case Management/Adjunct)  Date:  09/17/2015  Time:  2:13 PM  Type of Therapy:  Psychoeducational Skills  Participation Level:  Did Not Attend   Darrow Bussing 09/17/2015, 2:13 PM

## 2015-09-17 NOTE — Progress Notes (Signed)
Recreation Therapy Notes  Date: 02.22.17 Time: 3:00 pm Location: Community Room   Group Topic: Self-esteem, Coping skills  Goal Area(s) Addresses:  Patient will identify positive traits about self. Patient will identify at least one coping skill.  Behavioral Response: Attentive, Interactive  Intervention: All About Me  Activity: Patients were instructed to make an All About Me pamphlet including their life's motto, positive traits about themselves, healthy coping skills, and their support system.  Education: LRT educated patients on ways they can increase their self-esteem.  Education Outcome: Acknowledges education/In group clarification offered  Clinical Observations/Feedback: Patient completed activity. Patient contributed to group discussion by stating how her self-esteem affects her.  Jacquelynn Cree, LRT/CTRS 09/17/2015 4:23 PM
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
## 2015-09-17 NOTE — [facility] Group Notes (Signed)
[facility] LCSW Aftercare Discharge Planning Group Note   09/17/2015 12:08 PM  Participation Quality:  Patient was called to group but did not attend.   Lulu Riding, MSW, LCSWA

## 2015-09-17 NOTE — [facility] Group Notes (Signed)
[facility] Group Notes:  (Nursing/MHT/Case Management/Adjunct)  Date:  09/17/2015  Time:  9:30 PM  Type of Therapy:  Wrap-up Group  Participation Level:  Active  Participation Quality:  Appropriate  Affect:  Appropriate  Cognitive:  Alert  Insight:  Good  Engagement in Group:  Engaged  Modes of Intervention:  Discussion  Summary of Progress/Problems:  Tomasita Morrow 09/17/2015, 9:30 PM

## 2015-09-17 NOTE — Progress Notes (Addendum)
D:  Per pt self inventory pt reports sleeping poor, appetite fair, energy level normal, ability to pay attention good, rates depression at a 5 out of 10, hopelessness at a 5 out of 10, anxiety at a 2 out of 10, denies SI/HI/AVH, goal today: "To be the best i can be for myself and daughter, to be more sociable", anxious/flat during interaction.     A:  Emotional support provided, Encouraged pt to continue with treatment plan and attend all group activities, q15 min checks maintained for safety.  R:  Pt is receptive, going to some groups, pleasant and cooperative with staff and other patients on the unit.
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
## 2015-09-18 MED ORDER — CARBAMAZEPINE 200 MG PO TABS: 200.0000 mg | ORAL_TABLET | Freq: Two times a day (BID) | ORAL | Status: DC

## 2015-09-18 MED ORDER — TEMAZEPAM 15 MG PO CAPS: 15.0000 mg | ORAL_CAPSULE | Freq: Every day | ORAL | Status: DC

## 2015-09-18 MED ORDER — SPIRONOLACTONE 25 MG PO TABS: 25.0000 mg | ORAL_TABLET | Freq: Every day | ORAL | Status: DC

## 2015-09-18 MED ORDER — MENTHOL 3 MG MT LOZG
1.0000 | LOZENGE | OROMUCOSAL | Status: DC | PRN
  Filled 2015-09-18: qty 9
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
## 2015-09-18 NOTE — Progress Notes (Signed)
D:  Per pt self inventory pt reports sleeping fair, appetite good, energy level normal, ability to pay attention good, rates depression at a 0 out of 10, hopelessness at a 0 out of 10, anxiety at a 0 out of 10, denies SI/HI/AVH, goal today: "positive thoughts, go to group meetings"     A:  Emotional support provided, Encouraged pt to continue with treatment plan and attend all group activities, q15 min checks maintained for safety.  R:  Pt is receptive, going to groups, pleasant and cooperative with staff and other patients on the unit.
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
## 2015-09-18 NOTE — BHH Group Notes (Signed)
BHH LCSW Group Therapy  09/18/2015 3:47 PM  Type of Therapy:  Group Therapy  Participation Level:  Active  Participation Quality:  Appropriate and Attentive  Affect:  Appropriate  Cognitive:  Alert, Appropriate and Oriented  Insight:  Engaged  Engagement in Therapy:  Engaged  Modes of Intervention:  Discussion, Socialization and Support  Summary of Progress/Problems: Patient attended and participated in group introducing herself and sharing in an introductory activity that her self-care activity is "yeardsales, shopping, and antiquing". Patient shared her struggles with having limited family support and issues with her husband and feeling alone as her husband and daughter and liked to stay home and she wants to be more adventurous outside of the home in a group discussion about "Balance in Life". Patient shared some changes she plans to make for herself when she leaves the hospital.   Lulu Riding, MSW, LCSWA 09/18/2015, 3:47 PM

## 2015-09-18 NOTE — Progress Notes (Signed)
Recreation Therapy Notes  INPATIENT RECREATION TR PLAN  Patient Details Name: Carla Torres MRN: [id number] DOB: 02/16/1966 Today's Date: 09/18/2015  Rec Therapy Plan Is patient appropriate for Therapeutic Recreation?: Yes Treatment times per week: At least once a week TR Treatment/Interventions: 1:1 session, Group participation (Comment) (Appropriate participation in daily recreation therapy tx)  Discharge Criteria Pt will be discharged from therapy if:: Treatment goals are met, Discharged Treatment plan/goals/alternatives discussed and agreed upon by:: Patient/family  Discharge Summary Short term goals set: See Care Plan Short term goals met: Complete Progress toward goals comments: One-to-one attended Which groups?: Leisure education, Self-esteem One-to-one attended: Self-esteem, stress management Reason goals not met: N/A Therapeutic equipment acquired: None Reason patient discharged from therapy: Discharge from hospital Pt/family agrees with progress & goals achieved: Yes Date patient discharged from therapy: 09/18/15   Leonette Monarch, LRT/CTRS 09/18/2015, 4:49 PM

## 2015-09-18 NOTE — Plan of Care (Signed)
Problem: Ineffective individual coping Goal: STG: Patient will remain free from self harm Outcome: Progressing Pt has remained free from self harm     

## 2015-09-18 NOTE — [facility] Suicide Risk Assessment (Signed)
[facility] INPATIENT:  Family/Significant Other Suicide Prevention Education  Suicide Prevention Education:  Contact Attempts: Pt's husband Nylene Inlow at ph: [phone] has been identified by the patient as the family member/significant other with whom the patient will be residing, and identified as the person(s) who will aid the patient in the event of a mental health crisis.  With written consent from the patient, two attempts were made to provide suicide prevention education, prior to and/or following the patient's discharge.  We were unsuccessful in providing suicide prevention education.  A suicide education pamphlet was given to the patient to share with family/significant other.  CSW completed SPE with pt.  Date and time of first attempt: 09/18/15 at 1:37pm Date and time of second attempt:09/18/15 at 2:21pm  Mercy Riding 09/18/2015, 2:20 PM

## 2015-09-18 NOTE — Progress Notes (Signed)
Recreation Therapy Notes  Date: 02.23.17 Time: 3:00 pm Location: Community Room  Group Topic: Leisure Education  Goal Area(s) Addresses:  Patient will identify activities for each letter of the alphabet. Patient will verbalize ability to integrate positive leisure into life post d/c. Patient will verbalize ability to use leisure as a Associate Professor.  Behavioral Response: Attentive, Interactive, Disruptive  Intervention: Leisure Alphabet  Activity: Patients were given a Leisure Alphabet and instructed to think of a healthy leisure activity for each letter of the alphabet.  Education: LRT educated patients on what they need to participate in leisure.  Education Outcome: Acknowledges education/In group clarification offered  Clinical Observations/Feedback: Patient completed activity. Patient contributed to group discussion by stating some healthy leisure activities she wrote down. LRT had to redirect patient from having side conversations. Patient complied.  Jacquelynn Cree, LRT/CTRS 09/18/2015 4:47 PM

## 2015-09-18 NOTE — Progress Notes (Signed)
Patient ID: Carla Torres, female   DOB: [birth year], 50 y.o.   MRN: [id number] CSW attempted to make a f/u appointment for the pt and has been told by the pt and the pt's husband that they would prefer to find a Primary Care doctor for the pt's hospital follow-up on their own upon discharge.

## 2015-09-18 NOTE — Progress Notes (Signed)
D: Observed pt in day room interacting and watching TV. Patient alert and oriented x4. Patient denies SI/HI/AVH. Pt affect is anxious and depressed. Pt stated the day "started not good, but going to groups really helped."  Pt rated depression 2/10 and anxiety 4/10, and pt indicated that's a big improvement from when she came in. Pt stated "I've never done anything like I did on Monday". Pt later woke up twice in the night coughing. Other than dry cough, pt exhibiting no signs of illness.  A: Offered active listening and support. Provided therapeutic communication. Administered scheduled medications. Encouraged pt to continue attending group. R: Pt pleasant and cooperative. Pt medication compliant. Will continue Q15 min. checks. Safety maintained.

## 2015-09-18 NOTE — [facility] Suicide Risk Assessment (Signed)
[facility] Discharge Suicide Risk Assessment   Principal Problem: Bipolar II disorder, most recent episode major depressive [facility]) Discharge Diagnoses:  Patient Active Problem List   Diagnosis Date Noted  . HTN (hypertension) [I10] 09/17/2015  . Bipolar II disorder, most recent episode major depressive (HCC) [F31.81] 09/16/2015  . Tobacco use disorder [F17.200] 09/16/2015    Total Time spent with patient: 30 minutes  Musculoskeletal: Strength & Muscle Tone: within normal limits Gait & Station: normal Patient leans: N/A  Psychiatric Specialty Exam: Review of Systems  All other systems reviewed and are negative.   Blood pressure 117/58, pulse 81, temperature 98.2 F (36.8 C), temperature source Oral, resp. rate 18, height 5' 5.75" (1.67 m), weight 84.823 kg (187 lb), last menstrual period 09/16/2015, SpO2 98 %.Body mass index is 30.41 kg/(m^2).  General Appearance: Casual  Eye Contact::  Good  Speech:  Clear and Coherent409  Volume:  Normal  Mood:  Euthymic  Affect:  Appropriate  Thought Process:  Goal Directed  Orientation:  Full (Time, Place, and Person)  Thought Content:  WDL  Suicidal Thoughts:  No  Homicidal Thoughts:  No  Memory:  Immediate;   Fair Recent;   Fair Remote;   Fair  Judgement:  Impaired  Insight:  Present  Psychomotor Activity:  Normal  Concentration:  Fair  Recall:  Good  Fund of Knowledge:Good  Language: Good  Akathisia:  No  Handed:  Right  AIMS (if indicated):     Assets:  Communication Skills Desire for Improvement Housing Physical Health Resilience Social Support  Sleep:  Number of Hours: 7  Cognition: WNL  ADL's:  Intact   Mental Status Per Nursing Assessment::   On Admission:  Self-harm thoughts  Demographic Factors:  Caucasian  Loss Factors: Financial problems/change in socioeconomic status  Historical Factors: Prior suicide attempts, Family history of suicide, Family history of mental illness or substance abuse and  Impulsivity  Risk Reduction Factors:   Responsible for children under 67 years of age, Sense of responsibility to family, Employed, Living with another person, especially a relative and Positive social support  Continued Clinical Symptoms:  Bipolar Disorder:   Bipolar II  Cognitive Features That Contribute To Risk:  None    Suicide Risk:  Minimal: No identifiable suicidal ideation.  Patients presenting with no risk factors but with morbid ruminations; may be classified as minimal risk based on the severity of the depressive symptoms    Plan Of Care/Follow-up recommendations:  Activity:  As tolerated. Diet:  Low sodium heart healthy. Other:  Keep follow-up appointments.  Kristine Linea, MD 09/18/2015, 11:27 AM

## 2015-09-18 NOTE — [facility] Group Notes (Signed)
[facility] Group Notes:  (Nursing/MHT/Case Management/Adjunct)  Date:  09/18/2015  Time:  2:02 PM  Type of Therapy:  Movement Therapy  Participation Level:  Did Not Attend  Carla Torres Carla Torres Carla Torres 09/18/2015, 2:02 PM

## 2015-09-18 NOTE — Plan of Care (Signed)
Problem: [facility] Participation in Recreation Therapeutic Interventions Goal: STG-Patient will demonstrate improved self esteem by identif STG: Self-Esteem - Within 3 treatment sessions, patient will verbalize at least 5 positive affirmation statements in one treatment session to increase self-esteem post d/c.  Outcome: Completed/Met Date Met:  09/18/15 Treatment Session 1; Completed 1 out of 1: At approximately 9:10 am, LRT met with patient in patient room. Patient verbalized 5 positive affirmation statements. Patient reported it felt "great". LRT encouraged patient to continue saying positive affirmation statements. Intervention Used: I Am statements  Leonette Monarch, LRT/CTRS 02.23.17 11:47 am Goal: STG-Other Recreation Therapy Goal (Specify) STG: Stress Management - Within 3 treatment sessions, patient will verbalize understanding of the stress management techniques in one treatment session to increase stress management skills post d/c.  Outcome: Completed/Met Date Met:  09/18/15 Treatment Session 1; Completed 1 out of 1: At approximately 9:10 am, LRT met with patient in patient room. LRT educated and provided patient with handouts on stress management techniques. Patient verbalized understanding. LRT encouraged patient to read over and practice the stress management techniques. Intervention Used: Stress Management handouts  Leonette Monarch, LRT/CTRS 02.23.17 11:49 am

## 2015-09-18 NOTE — Progress Notes (Signed)
  [facility] Adult Case Management Discharge Plan :  Will you be returning to the same living situation after discharge:  Yes,  pt will be returning home to [place] to live with her family At discharge, do you have transportation home?: Yes,  pt will be picked up by her husband Do you have the ability to pay for your medications: Yes,  pt will be provided with medications at discharge  Release of information consent forms completed and in the chart;  Patient's signature needed at discharge.  Patient to Follow up at: Follow-up Information    Follow up with [facility] [place] [facility] [place] ([address]).   Why:  Please arrive to the walk-in clinic between the hours of 8am-5pm for your assessment for medication management and therapy.   Contact information:   [address] [place], [place] [address] Phone: [phone] Fax: [phone]      Next level of care provider has access to [facility] Link:no  Safety Planning and Suicide Prevention discussed: Yes,  CSW completed with pt  Have you used any form of tobacco in the last 30 days? (Cigarettes, Smokeless Tobacco, Cigars, and/or Pipes): Yes  Has patient been referred to the Quitline?: Yes  Patient has been referred for addiction treatment: Yes  Dorothe Pea Percell Lamboy 09/18/2015, 2:23 PM

## 2015-09-18 NOTE — Progress Notes (Signed)
D/C instructions/transition record/suicide risk assessmenr/meds/follow-up appointments reviewed, pt verbalized understanding, pt's belongings returned to pt, denies SI/HI/AVH, 7 day supply given.

## 2015-10-02 LAB — TSH: TSH: 1.51 u[IU]/mL (ref <=5.90)

## 2015-10-29 ENCOUNTER — Other Ambulatory Visit: Payer: Self-pay | Admitting: Psychiatry

## 2015-11-05 ENCOUNTER — Other Ambulatory Visit: Payer: Self-pay | Admitting: Psychiatry

## 2015-12-12 ENCOUNTER — Encounter: Payer: Self-pay | Admitting: General Practice

## 2015-12-15 ENCOUNTER — Encounter: Payer: Self-pay | Admitting: Family Medicine

## 2015-12-15 ENCOUNTER — Ambulatory Visit (INDEPENDENT_AMBULATORY_CARE_PROVIDER_SITE_OTHER): Payer: Self-pay | Admitting: Family Medicine

## 2015-12-15 VITALS — BP 130/78 | HR 60 | Temp 98.1°F | Resp 16 | Ht 66.0 in | Wt 190.5 lb

## 2015-12-15 DIAGNOSIS — I10 Essential (primary) hypertension: Secondary | ICD-10-CM

## 2015-12-15 DIAGNOSIS — F3181 Bipolar II disorder: Secondary | ICD-10-CM

## 2015-12-15 MED ORDER — ESCITALOPRAM OXALATE 10 MG PO TABS: 20.0000 mg | ORAL_TABLET | Freq: Every day | ORAL | Status: DC

## 2015-12-15 MED ORDER — CLONAZEPAM 1 MG PO TABS
1.0000 mg | ORAL_TABLET | Freq: Two times a day (BID) | ORAL | Status: DC | PRN
Start: 2015-12-15 — End: 2016-10-12

## 2015-12-15 MED ORDER — SPIRONOLACTONE 25 MG PO TABS: 25.0000 mg | ORAL_TABLET | Freq: Every day | ORAL | Status: DC

## 2015-12-15 NOTE — Progress Notes (Signed)
   Subjective:    Patient ID: Carla BatheJulia M Torres, female    DOB: 06-29-1966, 50 y.o.   MRN: [id number]  HPI New to establish.  Previous MD- Spears  Bipolar- pt tried to get off Lexapro in Feb b/c she didn't have insurance but 'i had a nervous breakdown and ended up in the hospital.  I tried to commit suicide'.  Currently on Lexapro 10mg - 2 pills daily for total of 20mg  and doing well.  Pt is not taking Tegretol- 'i felt drunk 24/7'.  Taking Klonopin as needed for high anxiety.  No current SI/HI.  HTN- chronic problem, on Spironolactone for both BP and fluid control due to swelling.  Denies CP, SOB, HAs, visual changes.   Review of Systems For ROS see HPI     Objective:   Physical Exam  Constitutional: She is oriented to person, place, and time. She appears well-developed and well-nourished. No distress.  obese  HENT:  Head: Normocephalic and atraumatic.  Eyes: Conjunctivae and EOM are normal. Pupils are equal, round, and reactive to light.  Neck: Normal range of motion. Neck supple. No thyromegaly present.  Cardiovascular: Normal rate, regular rhythm, normal heart sounds and intact distal pulses.   No murmur heard. Pulmonary/Chest: Effort normal and breath sounds normal. No respiratory distress.  Abdominal: Soft. She exhibits no distension. There is no tenderness.  Musculoskeletal: She exhibits no edema.  Lymphadenopathy:    She has no cervical adenopathy.  Neurological: She is alert and oriented to person, place, and time.  Skin: Skin is warm and dry.  Psychiatric: She has a normal mood and affect. Her behavior is normal.  Vitals reviewed.         Assessment & Plan:

## 2015-12-15 NOTE — Assessment & Plan Note (Signed)
New to provider, ongoing for pt.  She had a suicide attempt earlier this year when she attempted to stop Lexapro when she lost her insurance.  She feels her sxs are currently well controlled on 20mg  Lexapro.  Was contemplating changing her to Celexa due to cost but given her extreme sxs when she stopped medication will hold off on changes.  Refills provided on Lexapro and Clonazepam- which pt promises she will take correctly and she has no suicidal thoughts at this time.  Will follow closely.

## 2015-12-15 NOTE — Patient Instructions (Signed)
Follow up as scheduled in August Continue the Lexapro 2 tabs daily Continue the Spironolactone once daily Take 1/2 tab of Klonopin as needed for high anxiety situations Continue to work on healthy diet and regular exercise- it's a great stress outlet! Call with any questions or concerns Welcome!  We're glad to have you!!!

## 2015-12-15 NOTE — Assessment & Plan Note (Signed)
New to provider, ongoing for pt.  Currently well controlled on Spironolactone.  Asymptomatic.  Since pt doesn't have insurance will hold off on labs at this time.  Will plan to get labs done at upcoming visit when she has insurance

## 2015-12-15 NOTE — Progress Notes (Signed)
Pre visit review using our clinic review tool, if applicable. No additional management support is needed unless otherwise documented below in the visit note. 

## 2016-03-05 ENCOUNTER — Encounter: Payer: Self-pay | Admitting: Family Medicine

## 2016-03-05 ENCOUNTER — Ambulatory Visit (INDEPENDENT_AMBULATORY_CARE_PROVIDER_SITE_OTHER): Payer: 59 | Admitting: Family Medicine

## 2016-03-05 VITALS — BP 120/85 | HR 70 | Temp 98.1°F | Resp 16 | Ht 66.0 in | Wt 193.5 lb

## 2016-03-05 DIAGNOSIS — R21 Rash and other nonspecific skin eruption: Secondary | ICD-10-CM | POA: Diagnosis not present

## 2016-03-05 DIAGNOSIS — Z0000 Encounter for general adult medical examination without abnormal findings: Secondary | ICD-10-CM

## 2016-03-05 DIAGNOSIS — R238 Other skin changes: Secondary | ICD-10-CM

## 2016-03-05 LAB — CBC WITH DIFFERENTIAL/PLATELET
BASOS ABS: 82 {cells}/uL (ref 0–200)
BASOS PCT: 1 %
EOS ABS: 246 {cells}/uL (ref 15–500)
EOS PCT: 3 %
HCT: 42.1 % (ref 35.0–45.0)
HEMOGLOBIN: 14.6 g/dL (ref 11.7–15.5)
LYMPHS ABS: 2624 {cells}/uL (ref 850–3900)
Lymphocytes Relative: 32 %
MCH: 32.4 pg (ref 27.0–33.0)
MCHC: 34.7 g/dL (ref 32.0–36.0)
MCV: 93.3 fL (ref 80.0–100.0)
MPV: 10.7 fL (ref 7.5–12.5)
Monocytes Absolute: 820 cells/uL (ref 200–950)
Monocytes Relative: 10 %
NEUTROS ABS: 4428 {cells}/uL (ref 1500–7800)
Neutrophils Relative %: 54 %
Platelets: 355 10*3/uL (ref 140–400)
RBC: 4.51 MIL/uL (ref 3.80–5.10)
RDW: 13 % (ref 11.0–15.0)
WBC: 8.2 10*3/uL (ref 3.8–10.8)

## 2016-03-05 LAB — LIPID PANEL
CHOL/HDL RATIO: 6.1 ratio — AB (ref <=5.0)
CHOLESTEROL: 270 mg/dL — AB (ref 125–200)
HDL: 44 mg/dL — AB (ref >=46)
LDL Cholesterol: 180 mg/dL — ABNORMAL HIGH (ref <130)
Triglycerides: 231 mg/dL — ABNORMAL HIGH (ref <150)
VLDL: 46 mg/dL — AB (ref <30)

## 2016-03-05 LAB — BASIC METABOLIC PANEL
BUN: 10 mg/dL (ref 7–25)
CALCIUM: 9.7 mg/dL (ref 8.6–10.2)
CO2: 27 mmol/L (ref 20–31)
CREATININE: 0.76 mg/dL (ref 0.50–1.10)
Chloride: 101 mmol/L (ref 98–110)
GLUCOSE: 79 mg/dL (ref 65–99)
Potassium: 4.1 mmol/L (ref 3.5–5.3)
Sodium: 137 mmol/L (ref 135–146)

## 2016-03-05 LAB — HEPATIC FUNCTION PANEL
ALBUMIN: 4.3 g/dL (ref 3.6–5.1)
ALT: 33 U/L — ABNORMAL HIGH (ref 6–29)
AST: 22 U/L (ref 10–35)
Alkaline Phosphatase: 60 U/L (ref 33–115)
BILIRUBIN TOTAL: 0.3 mg/dL (ref 0.2–1.2)
Bilirubin, Direct: 0.1 mg/dL (ref <=0.2)
Indirect Bilirubin: 0.2 mg/dL (ref 0.2–1.2)
TOTAL PROTEIN: 7.5 g/dL (ref 6.1–8.1)

## 2016-03-05 LAB — TSH: TSH: 1.73 m[IU]/L

## 2016-03-05 MED ORDER — FLUTICASONE PROPIONATE 50 MCG/ACT NA SUSP
2.0000 | Freq: Every day | NASAL | 6 refills | Status: DC
Start: 2016-03-05 — End: 2017-04-28

## 2016-03-05 MED ORDER — TRIAMCINOLONE ACETONIDE 0.1 % EX OINT: 1.0000 "application " | TOPICAL_OINTMENT | Freq: Two times a day (BID) | CUTANEOUS | 1 refills | Status: AC

## 2016-03-05 NOTE — Progress Notes (Signed)
Pre visit review using our clinic review tool, if applicable. No additional management support is needed unless otherwise documented below in the visit note. 

## 2016-03-05 NOTE — Progress Notes (Signed)
   Subjective:    Patient ID: Carla Torres, female    DOB: 13-May-1966, 50 y.o.   MRN: [id number]  HPI CPE- [facility].  UTD on pap.  Tdap unknown but pt feels certain it was w/in 3-4 yrs.   Review of Systems Patient reports no vision/ hearing changes, adenopathy,fever, weight change,  persistant/recurrent hoarseness , swallowing issues, chest pain, palpitations, edema, persistant/recurrent cough, hemoptysis, dyspnea (rest/exertional/paroxysmal nocturnal), gastrointestinal bleeding (melena, rectal bleeding), abdominal pain, significant heartburn, bowel changes, GU symptoms (dysuria, hematuria, incontinence), Gyn symptoms (abnormal  bleeding, pain),  syncope, focal weakness, memory loss, numbness & tingling, hair/nail changes, abnormal bruising or bleeding, anxiety, or depression.   Recurrent skin outbreaks on neck/face- 'it's not acne'.  Pt reports this has been going on for 'a couple of years'.    Objective:   Physical Exam General Appearance:    Alert, cooperative, no distress, appears stated age, obese  Head:    Normocephalic, without obvious abnormality, atraumatic  Eyes:    PERRL, conjunctiva/corneas clear, EOM's intact, fundi    benign, both eyes  Ears:    Normal TM's and external ear canals, both ears  Nose:   Nares normal, septum midline, mucosa normal, no drainage    or sinus tenderness  Throat:   Lips, mucosa, and tongue normal; teeth and gums normal  Neck:   Supple, symmetrical, trachea midline, no adenopathy;    Thyroid: no enlargement/tenderness/nodules  Back:     Symmetric, no curvature, ROM normal, no CVA tenderness  Lungs:     Clear to auscultation bilaterally, respirations unlabored  Chest Wall:    No tenderness or deformity   Heart:    Regular rate and rhythm, S1 and S2 normal, no murmur, rub   or gallop  Breast Exam:    Deferred to GYN  Abdomen:     Soft, non-tender, bowel sounds active all four quadrants,    no masses, no organomegaly    Genitalia:    Deferred to GYN  Rectal:    Extremities:   Extremities normal, atraumatic, no cyanosis or edema  Pulses:   2+ and symmetric all extremities  Skin:   Skin color, texture, turgor normal, no rashes or lesions  Lymph nodes:   Cervical, supraclavicular, and axillary nodes normal  Neurologic:   CNII-XII intact, normal strength, sensation and reflexes    throughout          Assessment & Plan:  CPE- pt's PE WNL w/ exception of obesity.  Pt to call and schedule GYN appt.  Check labs.  Anticipatory guidance provided.   Rash- not visible on exam today but pt's mother told her it looked like eczema.  Will start Triamcinolone prn.  If no improvement will refer to Derm.  Pt expressed understanding and is in agreement w/ plan.

## 2016-03-05 NOTE — Patient Instructions (Signed)
Follow up in 6 months to recheck BP We'll notify you of your lab results and make any changes if needed Continue to work on healthy diet and regular exercise- you can do it! Apply the Triamcinolone ointment in a thin layer twice daily as needed for the breakouts Call and schedule your GYN appt for pap and mammo Call with any questions or concerns Have a great trip!!!

## 2016-03-06 LAB — VITAMIN D 25 HYDROXY (VIT D DEFICIENCY, FRACTURES): VIT D 25 HYDROXY: 39 ng/mL (ref 30–100)

## 2016-03-08 ENCOUNTER — Other Ambulatory Visit: Payer: Self-pay | Admitting: General Practice

## 2016-03-08 DIAGNOSIS — E785 Hyperlipidemia, unspecified: Secondary | ICD-10-CM

## 2016-03-08 MED ORDER — ATORVASTATIN CALCIUM 20 MG PO TABS: 20.0000 mg | ORAL_TABLET | Freq: Every day | ORAL | 6 refills | Status: DC

## 2016-04-09 ENCOUNTER — Other Ambulatory Visit: Payer: Self-pay | Admitting: Family Medicine

## 2016-07-12 ENCOUNTER — Encounter: Payer: Self-pay | Admitting: Physician Assistant

## 2016-07-12 ENCOUNTER — Other Ambulatory Visit: Payer: Self-pay | Admitting: Family Medicine

## 2016-07-12 ENCOUNTER — Ambulatory Visit (INDEPENDENT_AMBULATORY_CARE_PROVIDER_SITE_OTHER): Payer: 59 | Admitting: Physician Assistant

## 2016-07-12 VITALS — BP 116/80 | HR 58 | Temp 98.2°F | Resp 14 | Ht 66.0 in | Wt 191.0 lb

## 2016-07-12 DIAGNOSIS — J208 Acute bronchitis due to other specified organisms: Secondary | ICD-10-CM | POA: Diagnosis not present

## 2016-07-12 DIAGNOSIS — B9689 Other specified bacterial agents as the cause of diseases classified elsewhere: Secondary | ICD-10-CM

## 2016-07-12 MED ORDER — DOXYCYCLINE HYCLATE 100 MG PO CAPS: 100.0000 mg | ORAL_CAPSULE | Freq: Two times a day (BID) | ORAL | 0 refills | Status: DC

## 2016-07-12 MED ORDER — BENZONATATE 100 MG PO CAPS: 100.0000 mg | ORAL_CAPSULE | Freq: Three times a day (TID) | ORAL | 0 refills | Status: DC | PRN

## 2016-07-12 NOTE — Progress Notes (Signed)
Pre visit review using our clinic review tool, if applicable. No additional management support is needed unless otherwise documented below in the visit note. 

## 2016-07-12 NOTE — Patient Instructions (Signed)
Take antibiotic (Doxycycline) as directed.  Increase fluids.  Get plenty of rest. Use Mucinex for congestion. Tessalon for cough. Take a daily probiotic (I recommend Align or Culturelle, but even Activia Yogurt may be beneficial).  A humidifier placed in the bedroom may offer some relief for a dry, scratchy throat of nasal irritation.    Get a good hydrating cream -- Ceraphil or Aquaphor. You can use hydrocortisone to the area for a few days as well.   Read information below on acute bronchitis. Please call or return to clinic if symptoms are not improving.  Acute Bronchitis Bronchitis is when the airways that extend from the windpipe into the lungs get red, puffy, and painful (inflamed). Bronchitis often causes thick spit (mucus) to develop. This leads to a cough. A cough is the most common symptom of bronchitis. In acute bronchitis, the condition usually begins suddenly and goes away over time (usually in 2 weeks). Smoking, allergies, and asthma can make bronchitis worse. Repeated episodes of bronchitis may cause more lung problems.  HOME CARE  Rest.  Drink enough fluids to keep your pee (urine) clear or pale yellow (unless you need to limit fluids as told by your doctor).  Only take over-the-counter or prescription medicines as told by your doctor.  Avoid smoking and secondhand smoke. These can make bronchitis worse. If you are a smoker, think about using nicotine gum or skin patches. Quitting smoking will help your lungs heal faster.  Reduce the chance of getting bronchitis again by:  Washing your hands often.  Avoiding people with cold symptoms.  Trying not to touch your hands to your mouth, nose, or eyes.  Follow up with your doctor as told.  GET HELP IF: Your symptoms do not improve after 1 week of treatment. Symptoms include:  Cough.  Fever.  Coughing up thick spit.  Body aches.  Chest congestion.  Chills.  Shortness of breath.  Sore throat.  GET HELP RIGHT  AWAY IF:   You have an increased fever.  You have chills.  You have severe shortness of breath.  You have bloody thick spit (sputum).  You throw up (vomit) often.  You lose too much body fluid (dehydration).  You have a severe headache.  You faint.  MAKE SURE YOU:   Understand these instructions.  Will watch your condition.  Will get help right away if you are not doing well or get worse. Document Released: 12/29/2007 Document Revised: 03/14/2013 Document Reviewed: 01/02/2013 [facility] Patient Information 2015 [employer] [place] [place]. This information is not intended to replace advice given to you by your health care provider. Make sure you discuss any questions you have with your health care provider.

## 2016-07-12 NOTE — Progress Notes (Signed)
Patient presents to clinic today c/o 2 weeks of head congestion with chest congestion and cough productive thick yellow-green sputum. Endorses low-grade fever intermittently. Endorses sore throat. Denies chest pain but notes some tenderness with frequent coughing. Denies recent travel. Patient is a smoker but denies history of asthma or COPD.   Past Medical History:  Diagnosis Date  . Anxiety   . Depression   . Hyperlipidemia   . Hypertension     Current Outpatient Prescriptions on File Prior to Visit  Medication Sig Dispense Refill  . clonazePAM (KLONOPIN) 1 MG tablet Take 1 tablet (1 mg total) by mouth 2 (two) times daily as needed for anxiety. 30 tablet 1  . escitalopram (LEXAPRO) 10 MG tablet TAKE TWO TABLETS BY MOUTH DAILY 60 tablet 2  . fluticasone (FLONASE) 50 MCG/ACT nasal spray Place 2 sprays into both nostrils daily. 16 g 6  . spironolactone (ALDACTONE) 25 MG tablet Take 1 tablet (25 mg total) by mouth daily. 30 tablet 3  . triamcinolone ointment (KENALOG) 0.1 % Apply 1 application topically 2 (two) times daily. 80 g 1  . atorvastatin (LIPITOR) 20 MG tablet Take 1 tablet (20 mg total) by mouth daily. (Patient not taking: Reported on 07/12/2016) 30 tablet 6   No current facility-administered medications on file prior to visit.     Allergies  Allergen Reactions  . Codeine Nausea And Vomiting  . Pravastatin Other (See Comments)    Hair loss  . Albuterol Rash    Hives and itchy throat    Family History  Problem Relation Age of Onset  . Hypertension Mother   . Hypertension Maternal Grandmother     Social History   Social History  . Marital status: Married    Spouse name: N/A  . Number of children: N/A  . Years of education: N/A   Social History Main Topics  . Smoking status: Current Every Day Smoker    Packs/day: 0.50    Types: Cigarettes  . Smokeless tobacco: Never Used  . Alcohol use Yes     Comment: 2x week; 1-2 glasses of wine  . Drug use: No  .  Sexual activity: No   Other Topics Concern  . None   Social History Narrative  . None   Review of Systems - See HPI.  All other ROS are negative.  BP 116/80   Pulse (!) 58   Temp 98.2 F (36.8 C) (Oral)   Resp 14   Ht 5\' 6"  (1.676 m)   Wt 191 lb (86.6 kg)   SpO2 98%   BMI 30.83 kg/m   Physical Exam  Constitutional: She is oriented to person, place, and time and well-developed, well-nourished, and in no distress.  HENT:  Head: Normocephalic and atraumatic.  Right Ear: External ear normal.  Left Ear: External ear normal.  Nose: Nose normal.  Mouth/Throat: Oropharynx is clear and moist. No oropharyngeal exudate.  TM within normal limits bilaterally.   Eyes: Conjunctivae are normal.  Neck: Neck supple.  Cardiovascular: Normal rate, regular rhythm, normal heart sounds and intact distal pulses.   Pulmonary/Chest: Effort normal and breath sounds normal. No respiratory distress. She has no wheezes. She has no rales. She exhibits no tenderness.  Neurological: She is alert and oriented to person, place, and time.  Skin: Skin is warm and dry. No rash noted.  Psychiatric: Affect normal.  Vitals reviewed.  Assessment/Plan: 1. Acute bacterial bronchitis Rx Doxycycline. Tessalon as directed. Supportive measures and OTC medications reviewed. FU discussed.  -  doxycycline (VIBRAMYCIN) 100 MG capsule; Take 1 capsule (100 mg total) by mouth 2 (two) times daily.  Dispense: 14 capsule; Refill: 0 - benzonatate (TESSALON) 100 MG capsule; Take 1 capsule (100 mg total) by mouth 3 (three) times daily as needed.  Dispense: 30 capsule; Refill: 0   Piedad ClimesMartin, Paiden Caraveo Cody, [place] JerseyPA-C

## 2016-07-30 ENCOUNTER — Other Ambulatory Visit: Payer: Self-pay | Admitting: Family Medicine

## 2016-08-20 ENCOUNTER — Ambulatory Visit (INDEPENDENT_AMBULATORY_CARE_PROVIDER_SITE_OTHER): Payer: BLUE CROSS/BLUE SHIELD | Admitting: Family Medicine

## 2016-08-20 ENCOUNTER — Encounter: Payer: Self-pay | Admitting: Family Medicine

## 2016-08-20 VITALS — BP 119/83 | HR 71 | Temp 97.9°F | Resp 16 | Ht 66.0 in | Wt 192.4 lb

## 2016-08-20 DIAGNOSIS — M7742 Metatarsalgia, left foot: Secondary | ICD-10-CM | POA: Diagnosis not present

## 2016-08-20 DIAGNOSIS — M7741 Metatarsalgia, right foot: Secondary | ICD-10-CM | POA: Diagnosis not present

## 2016-08-20 MED ORDER — MELOXICAM 15 MG PO TABS: 15.0000 mg | ORAL_TABLET | Freq: Every day | ORAL | 1 refills | Status: DC

## 2016-08-20 NOTE — Progress Notes (Signed)
   Subjective:    Patient ID: Carla Torres, female    DOB: Nov 27, 1965, 51 y.o.   MRN: [id number]  HPI L foot pain- 'it's been hurting for months'.  Initially thought it was from wearing heels but will have pain even when wearing tennis shoes.  Pain is located under ball of foot.  Will have swelling under foot and redness will develop.  Pain worsens throughout the day.  Some pain on R foot as well but 'not nearly as bad'.   Review of Systems For ROS see HPI     Objective:   Physical Exam  Constitutional: She is oriented to person, place, and time. She appears well-developed and well-nourished. No distress.  Cardiovascular: Intact distal pulses.   Musculoskeletal: She exhibits tenderness (TTP over L 2nd metatarsal head). She exhibits no edema.  Dropped transverse arch of feet bilaterally- L>R  Neurological: She is alert and oriented to person, place, and time.  Skin: Skin is warm and dry.  Psychiatric: She has a normal mood and affect. Her behavior is normal. Thought content normal.  Vitals reviewed.         Assessment & Plan:  Metatarsalgia- new.  L>R, suspect this due to her dropped transverse arches and wearing heels regularly.  Start Meloxicam.  Refer to sports med for possible orthotics.  Reviewed supportive care and red flags that should prompt return.  Pt expressed understanding and is in agreement w/ plan.

## 2016-08-20 NOTE — Progress Notes (Signed)
Pre visit review using our clinic review tool, if applicable. No additional management support is needed unless otherwise documented below in the visit note. 

## 2016-08-20 NOTE — Patient Instructions (Signed)
Follow up as needed/scheduled Start the Meloxicam once daily- take w/ food- for pain and inflammation Wear good, padded shoes We'll call you with your Sports Med appt Call with any questions or concerns Hang in there!!!

## 2016-08-24 ENCOUNTER — Ambulatory Visit: Payer: BLUE CROSS/BLUE SHIELD | Admitting: Family Medicine

## 2016-08-25 ENCOUNTER — Encounter: Payer: Self-pay | Admitting: Physician Assistant

## 2016-08-25 ENCOUNTER — Ambulatory Visit (INDEPENDENT_AMBULATORY_CARE_PROVIDER_SITE_OTHER): Payer: BLUE CROSS/BLUE SHIELD | Admitting: Physician Assistant

## 2016-08-25 VITALS — BP 118/78 | HR 54 | Temp 98.4°F | Resp 14 | Ht 66.0 in | Wt 195.0 lb

## 2016-08-25 DIAGNOSIS — J069 Acute upper respiratory infection, unspecified: Secondary | ICD-10-CM

## 2016-08-25 DIAGNOSIS — B9789 Other viral agents as the cause of diseases classified elsewhere: Secondary | ICD-10-CM | POA: Diagnosis not present

## 2016-08-25 MED ORDER — OSELTAMIVIR PHOSPHATE 75 MG PO CAPS: 75.0000 mg | ORAL_CAPSULE | Freq: Two times a day (BID) | ORAL | 0 refills | Status: DC

## 2016-08-25 NOTE — Progress Notes (Signed)
Patient presents to clinic today c/o sudden onset of fatigue, runny nose, sore throat on Monday night. Notes developing dry cough over night. Denies sinus pain, ear pain or tooth pain. Notes sinus pressure. Denies known fever but notes significant chills. Denies recent travel or sick contact.  Has had flu shot. Has had co-workers who have been sick with the flu this past week.  Past Medical History:  Diagnosis Date  . Anxiety   . Depression   . Hyperlipidemia   . Hypertension     Current Outpatient Prescriptions on File Prior to Visit  Medication Sig Dispense Refill  . clonazePAM (KLONOPIN) 1 MG tablet Take 1 tablet (1 mg total) by mouth 2 (two) times daily as needed for anxiety. 30 tablet 1  . escitalopram (LEXAPRO) 10 MG tablet TAKE TWO TABLETS BY MOUTH DAILY 60 tablet 3  . fluticasone (FLONASE) 50 MCG/ACT nasal spray Place 2 sprays into both nostrils daily. 16 g 6  . meloxicam (MOBIC) 15 MG tablet Take 1 tablet (15 mg total) by mouth daily. 30 tablet 1  . spironolactone (ALDACTONE) 25 MG tablet TAKE ONE TABLET BY MOUTH DAILY 30 tablet 6  . triamcinolone ointment (KENALOG) 0.1 % Apply 1 application topically 2 (two) times daily. 80 g 1  . atorvastatin (LIPITOR) 20 MG tablet Take 1 tablet (20 mg total) by mouth daily. (Patient not taking: Reported on 08/25/2016) 30 tablet 6   No current facility-administered medications on file prior to visit.     Allergies  Allergen Reactions  . Codeine Nausea And Vomiting  . Pravastatin Other (See Comments)    Hair loss  . Albuterol Rash    Hives and itchy throat    Family History  Problem Relation Age of Onset  . Hypertension Mother   . Hypertension Maternal Grandmother     Social History   Social History  . Marital status: Married    Spouse name: N/A  . Number of children: N/A  . Years of education: N/A   Social History Main Topics  . Smoking status: Current Every Day Smoker    Packs/day: 0.50    Types: Cigarettes  .  Smokeless tobacco: Never Used  . Alcohol use Yes     Comment: 2x week; 1-2 glasses of wine  . Drug use: No  . Sexual activity: No   Other Topics Concern  . None   Social History Narrative  . None   Review of Systems - See HPI.  All other ROS are negative.  BP 118/78   Pulse (!) 54   Temp 98.4 F (36.9 C) (Oral)   Resp 14   Ht 5\' 6"  (1.676 m)   Wt 195 lb (88.5 kg)   SpO2 98%   BMI 31.47 kg/m   Physical Exam  Constitutional: She is oriented to person, place, and time and well-developed, well-nourished, and in no distress.  HENT:  Head: Normocephalic and atraumatic.  Right Ear: External ear normal.  Left Ear: External ear normal.  Nose: Rhinorrhea present.  Mouth/Throat: Uvula is midline, oropharynx is clear and moist and mucous membranes are normal.  Eyes: Conjunctivae are normal.  Neck: Neck supple.  Cardiovascular: Normal rate, regular rhythm, normal heart sounds and intact distal pulses.   Pulmonary/Chest: Effort normal and breath sounds normal. No respiratory distress. She has no wheezes. She has no rales. She exhibits no tenderness.  Neurological: She is alert and oriented to person, place, and time.  Skin: Skin is warm and dry. No rash  noted.  Psychiatric: Affect normal.  Vitals reviewed.  Assessment/Plan: 1. Viral URI Flu-like symptoms. Known exposure. Rapid flu negative. Discussed this can be inaccurate. Also discussed that everyone with a flu-like illness does not require antiviral. After long discussion of risks and benefits, patient adamant regarding antiviral. Rx Tamiflu. Supportive measures and OTC medications reviewed.  - oseltamivir (TAMIFLU) 75 MG capsule; Take 1 capsule (75 mg total) by mouth 2 (two) times daily.  Dispense: 10 capsule; Refill: 0   Piedad Climes, [place]

## 2016-08-25 NOTE — Patient Instructions (Signed)
Please stay well-hydrated and get plenty of rest.  Use saline nasal spray to flush out any nasal congestion.  I have sent in a prescription cough medication for you to use as directed.  Again flu-swab is negative but you are having some mild flu-like symptoms. Again Tamilfu is not needed by everyone who has flu-like symptoms if they are not high risk for complications. You are opting to take Tamilfu.  Please take as directed.  Follow-up if symptoms are not resolving.

## 2016-08-25 NOTE — Progress Notes (Signed)
Pre visit review using our clinic review tool, if applicable. No additional management support is needed unless otherwise documented below in the visit note. 

## 2016-08-31 ENCOUNTER — Ambulatory Visit: Payer: BLUE CROSS/BLUE SHIELD | Admitting: Family Medicine

## 2016-09-08 ENCOUNTER — Encounter: Payer: Self-pay | Admitting: Family Medicine

## 2016-09-08 ENCOUNTER — Encounter: Payer: Self-pay | Admitting: Emergency Medicine

## 2016-09-08 ENCOUNTER — Ambulatory Visit (INDEPENDENT_AMBULATORY_CARE_PROVIDER_SITE_OTHER): Payer: BLUE CROSS/BLUE SHIELD | Admitting: Family Medicine

## 2016-09-08 ENCOUNTER — Telehealth: Payer: Self-pay | Admitting: Family Medicine

## 2016-09-08 VITALS — BP 122/82 | HR 78 | Temp 98.6°F | Resp 16 | Ht 66.0 in | Wt 194.1 lb

## 2016-09-08 DIAGNOSIS — J029 Acute pharyngitis, unspecified: Secondary | ICD-10-CM | POA: Diagnosis not present

## 2016-09-08 LAB — POCT RAPID STREP A (OFFICE): Rapid Strep A Screen: POSITIVE — AB

## 2016-09-08 MED ORDER — AMOXICILLIN 875 MG PO TABS: 875.0000 mg | ORAL_TABLET | Freq: Two times a day (BID) | ORAL | 0 refills | Status: DC

## 2016-09-08 NOTE — Progress Notes (Signed)
   Subjective:    Patient ID: Carla BatheJulia M Torres, female    DOB: [birth year], 51 y.o.   MRN: [id number]  HPI URI- daughter was dx'd w/ Flu on Friday.  Pt started feeling poorly on Saturday.  Developed sore throat on Tuesday.  Painful to swallow.  + chills at night.  + HA.  No N/V/D.  Drinking plenty of fluids.  Bilateral ear pain, L>R.  + cough- productive of greenish/yellow sputum.  Denies sinus pain/pressure.   Review of Systems For ROS see HPI     Objective:   Physical Exam  Constitutional: She is oriented to person, place, and time. She appears well-developed and well-nourished. No distress.  HENT:  Head: Normocephalic and atraumatic.  Nose: Nose normal.  TMs normal bilaterally No TTP over sinuses Bilateral tonsillar enlargement w/ erythema and exudate  Neck: Normal range of motion. Neck supple.  Cardiovascular: Normal rate, regular rhythm and normal heart sounds.   Pulmonary/Chest: Effort normal and breath sounds normal. No respiratory distress. She has no wheezes. She has no rales.  Lymphadenopathy:    She has cervical adenopathy.  Neurological: She is alert and oriented to person, place, and time.  Skin: Skin is warm and dry.  Psychiatric: She has a normal mood and affect. Her behavior is normal. Thought content normal.  Vitals reviewed.         Assessment & Plan:  Sore throat- pt's sxs, PE are consistent w/ strep.  Rapid test confirms.  Start abx.  Reviewed supportive care and red flags that should prompt return.  Pt expressed understanding and is in agreement w/ plan.

## 2016-09-08 NOTE — Patient Instructions (Signed)
Follow up as needed/scheduled Start the Amoxicillin twice daily- take w/ food Drink plenty of fluids Alternate tylenol/ibuprofen as needed for pain/fever REST! Call with any questions or concerns Hang in there!!!

## 2016-09-08 NOTE — Progress Notes (Signed)
Pre visit review using our clinic review tool, if applicable. No additional management support is needed unless otherwise documented below in the visit note. 

## 2016-09-08 NOTE — Addendum Note (Signed)
Addended by: Sheliah HatchABORI, KATHERINE E on: 09/08/2016 04:41 PM   Modules accepted: Orders

## 2016-09-17 ENCOUNTER — Other Ambulatory Visit: Payer: Self-pay | Admitting: Obstetrics and Gynecology

## 2016-09-17 DIAGNOSIS — Z01419 Encounter for gynecological examination (general) (routine) without abnormal findings: Secondary | ICD-10-CM | POA: Diagnosis not present

## 2016-09-17 DIAGNOSIS — Z6831 Body mass index (BMI) 31.0-31.9, adult: Secondary | ICD-10-CM | POA: Diagnosis not present

## 2016-09-17 DIAGNOSIS — Z1231 Encounter for screening mammogram for malignant neoplasm of breast: Secondary | ICD-10-CM | POA: Diagnosis not present

## 2016-09-17 DIAGNOSIS — Z124 Encounter for screening for malignant neoplasm of cervix: Secondary | ICD-10-CM | POA: Diagnosis not present

## 2016-09-21 ENCOUNTER — Other Ambulatory Visit: Payer: Self-pay | Admitting: Obstetrics and Gynecology

## 2016-09-21 DIAGNOSIS — R928 Other abnormal and inconclusive findings on diagnostic imaging of breast: Secondary | ICD-10-CM

## 2016-09-21 LAB — CYTOLOGY - PAP

## 2016-09-23 ENCOUNTER — Ambulatory Visit (INDEPENDENT_AMBULATORY_CARE_PROVIDER_SITE_OTHER): Payer: BLUE CROSS/BLUE SHIELD | Admitting: Family Medicine

## 2016-09-23 ENCOUNTER — Ambulatory Visit
Admission: RE | Admit: 2016-09-23 | Discharge: 2016-09-23 | Disposition: A | Discharge status: Home / Self Care | Payer: BLUE CROSS/BLUE SHIELD | Source: Ambulatory Visit | Attending: Obstetrics and Gynecology | Admitting: Obstetrics and Gynecology

## 2016-09-23 ENCOUNTER — Encounter: Payer: Self-pay | Admitting: Family Medicine

## 2016-09-23 VITALS — BP 123/73 | HR 69 | Temp 98.2°F | Resp 17 | Ht 66.0 in | Wt 192.2 lb

## 2016-09-23 DIAGNOSIS — J029 Acute pharyngitis, unspecified: Secondary | ICD-10-CM | POA: Diagnosis not present

## 2016-09-23 DIAGNOSIS — R928 Other abnormal and inconclusive findings on diagnostic imaging of breast: Secondary | ICD-10-CM

## 2016-09-23 DIAGNOSIS — R921 Mammographic calcification found on diagnostic imaging of breast: Secondary | ICD-10-CM | POA: Diagnosis not present

## 2016-09-23 LAB — POCT RAPID STREP A (OFFICE): Rapid Strep A Screen: NEGATIVE

## 2016-09-23 MED ORDER — PROMETHAZINE-DM 6.25-15 MG/5ML PO SYRP: 5.0000 mL | ORAL_SOLUTION | Freq: Four times a day (QID) | ORAL | 0 refills | Status: DC | PRN

## 2016-09-23 NOTE — Patient Instructions (Signed)
Follow up as needed/scheduled Thankfully the strep is negative! This appears to be a viral/allergy combo and should improve w/ time Drink plenty of fluids Use the Promethazine cough syrup as needed- will cause drowsiness Mucinex DM for daytime cough w/o drowsiness REST!!! Start daily Claritin or Zyrtec for the allergy component Alternate tylenol/ibuprofen for the headaches Call with any questions or concerns Hang in there!!!

## 2016-09-23 NOTE — Progress Notes (Signed)
   Subjective:    Patient ID: Carla Torres, female    DOB: 04/19/1966, 51 y.o.   MRN: [id number]  HPI URI- pt was seen on 2/14 and dx'd w/ strep.  sxs improved w/ abx and then this week again developed HA, nasal congestion, sore throat, cough, neck pain.  No fevers.  sxs have been worsening since Sunday.  + sinus pressure.  No tooth pain.  Intermittent nausea, no vomiting.   Review of Systems For ROS see HPI     Objective:   Physical Exam  Constitutional: She is oriented to person, place, and time. She appears well-developed and well-nourished. No distress.  HENT:  Head: Normocephalic and atraumatic.  Right Ear: Tympanic membrane normal.  Left Ear: Tympanic membrane normal.  Nose: Mucosal edema and rhinorrhea present. Right sinus exhibits no maxillary sinus tenderness and no frontal sinus tenderness. Left sinus exhibits no maxillary sinus tenderness and no frontal sinus tenderness.  Mouth/Throat: Mucous membranes are normal. Posterior oropharyngeal erythema (w/ PND) present.  Eyes: Conjunctivae and EOM are normal. Pupils are equal, round, and reactive to light.  Neck: Normal range of motion. Neck supple.  Cardiovascular: Normal rate, regular rhythm and normal heart sounds.   Pulmonary/Chest: Effort normal and breath sounds normal. No respiratory distress. She has no wheezes. She has no rales.  + hacking cough  Lymphadenopathy:    She has no cervical adenopathy.  Neurological: She is alert and oriented to person, place, and time.  Skin: Skin is warm and dry.  Psychiatric: She has a normal mood and affect. Her behavior is normal. Thought content normal.  Vitals reviewed.         Assessment & Plan:  Sore throat- no evidence of strep on PE and rapid strep was negative, indicating complete tx of previous infxn.  Suspect this is combo of viral/allergy process.  No need for abx.  Reviewed supportive care and red flags that should prompt return.  Pt expressed understanding and is in  agreement w/ plan.

## 2016-09-23 NOTE — Progress Notes (Signed)
Pre visit review using our clinic review tool, if applicable. No additional management support is needed unless otherwise documented below in the visit note. 

## 2016-10-12 ENCOUNTER — Other Ambulatory Visit: Payer: Self-pay | Admitting: Family Medicine

## 2016-10-12 ENCOUNTER — Encounter: Payer: Self-pay | Admitting: General Practice

## 2016-10-12 NOTE — Telephone Encounter (Signed)
Last OV 09/23/16 (sore throat) Clonazepam last filled 12/15/15 #30 with 1

## 2016-10-12 NOTE — Telephone Encounter (Signed)
Med faxed and mychart message sent to pt to schedule a BP follow up appt.

## 2016-10-12 NOTE — Telephone Encounter (Signed)
Ok for #30 but no further meds w/o 6 month BP and cholesterol f/u

## 2016-12-21 ENCOUNTER — Encounter (HOSPITAL_BASED_OUTPATIENT_CLINIC_OR_DEPARTMENT_OTHER): Payer: Self-pay

## 2016-12-21 ENCOUNTER — Emergency Department (HOSPITAL_BASED_OUTPATIENT_CLINIC_OR_DEPARTMENT_OTHER)
Admission: EM | Admit: 2016-12-21 | Discharge: 2016-12-21 | Disposition: A | Discharge status: Home / Self Care | Payer: Self-pay | Attending: Emergency Medicine | Admitting: Emergency Medicine

## 2016-12-21 ENCOUNTER — Emergency Department (HOSPITAL_BASED_OUTPATIENT_CLINIC_OR_DEPARTMENT_OTHER): Payer: Self-pay

## 2016-12-21 DIAGNOSIS — Y9389 Activity, other specified: Secondary | ICD-10-CM | POA: Insufficient documentation

## 2016-12-21 DIAGNOSIS — S62355A Nondisplaced fracture of shaft of fourth metacarpal bone, left hand, initial encounter for closed fracture: Secondary | ICD-10-CM | POA: Insufficient documentation

## 2016-12-21 DIAGNOSIS — Z79899 Other long term (current) drug therapy: Secondary | ICD-10-CM | POA: Insufficient documentation

## 2016-12-21 DIAGNOSIS — F17210 Nicotine dependence, cigarettes, uncomplicated: Secondary | ICD-10-CM | POA: Insufficient documentation

## 2016-12-21 DIAGNOSIS — Y998 Other external cause status: Secondary | ICD-10-CM | POA: Insufficient documentation

## 2016-12-21 DIAGNOSIS — W1839XA Other fall on same level, initial encounter: Secondary | ICD-10-CM | POA: Insufficient documentation

## 2016-12-21 DIAGNOSIS — E785 Hyperlipidemia, unspecified: Secondary | ICD-10-CM | POA: Insufficient documentation

## 2016-12-21 DIAGNOSIS — I10 Essential (primary) hypertension: Secondary | ICD-10-CM | POA: Insufficient documentation

## 2016-12-21 DIAGNOSIS — M25572 Pain in left ankle and joints of left foot: Secondary | ICD-10-CM | POA: Insufficient documentation

## 2016-12-21 DIAGNOSIS — W19XXXA Unspecified fall, initial encounter: Secondary | ICD-10-CM

## 2016-12-21 DIAGNOSIS — Y92018 Other place in single-family (private) house as the place of occurrence of the external cause: Secondary | ICD-10-CM | POA: Insufficient documentation

## 2016-12-21 NOTE — ED Notes (Signed)
ED Provider at bedside. 

## 2016-12-21 NOTE — Discharge Instructions (Signed)
Alternate 600 mg of ibuprofen and [phone] mg of Tylenol every 3 hours for the next few days for pain. Apply ice or heat to the affected areas for comfort.   Gently stretch the left ankle throughout the day and during hot showers. Follow-up with hand surgery for reevaluation of your hand fracture. Return to the ED if any concerning signs or symptoms develop.

## 2016-12-21 NOTE — ED Provider Notes (Signed)
[facility] Provider Note   CSN: [id number] Arrival date & time: 12/21/16  1503     History   Chief Complaint Chief Complaint  Patient presents with  . Fall    HPI MONCERRAT Torres is a 51 y.o. female  Who presents today with chief complaint acute onset, progressively improving left hand and left ankle pan which began earlier today. Patient states that at around 12:45 PM today she misstepped coming out of her house and fell on  Her back. She denies hitting her head or loss of consciousness. She endorses immediate onset of Severe pain of left ankle and left hand. She states currently that pain is constant, sharp and throbbing. Palpation and movement worsened pain in both areas. Pain in the left hand is localized to the lateral hand radiating into the fourth and fifth digits. Pain in the ankle is localized to the lateral aspect. She denies numbness, tingling, weakness. She endorses difficulty grabbing objects with her left hand due to pain. She has taken Motrin with improvement in her symptoms. Ice is also somewhat helpful. She denies headaches, dizziness, syncope, chest pain, shortness of breath, abdominal pain, or any other symptoms at this time.  The history is provided by the patient.    Past Medical History:  Diagnosis Date  . Anxiety   . Depression   . Hyperlipidemia   . Hypertension     Patient Active Problem List   Diagnosis Date Noted  . HTN (hypertension) 09/17/2015  . Bipolar II disorder, most recent episode major depressive (HCC) 09/16/2015  . Tobacco use disorder 09/16/2015    Past Surgical History:  Procedure Laterality Date  . APPENDECTOMY    . CHOLECYSTECTOMY    . COLON RESECTION    . COLON SURGERY  1995  . EXPLORATION POST OPERATIVE OPEN HEART  1969  . MASTECTOMY    . PATENT DUCTUS ARTERIOUS REPAIR      OB History    No data available       Home Medications    Prior to Admission medications   Medication Sig Start Date End Date Taking?  Authorizing Provider  atorvastatin (LIPITOR) 20 MG tablet Take 1 tablet (20 mg total) by mouth daily. 03/08/16   Sheliah Hatch, MD  clonazePAM (KLONOPIN) 1 MG tablet TAKE ONE TABLET BY MOUTH TWICE A DAY AS NEEDED FOR ANXIETY 10/12/16   Sheliah Hatch, MD  escitalopram (LEXAPRO) 10 MG tablet TAKE TWO TABLETS BY MOUTH DAILY 07/30/16   Sheliah Hatch, MD  fluticasone (FLONASE) 50 MCG/ACT nasal spray Place 2 sprays into both nostrils daily. 03/05/16   Sheliah Hatch, MD  meloxicam (MOBIC) 15 MG tablet Take 1 tablet (15 mg total) by mouth daily. 08/20/16   Sheliah Hatch, MD  promethazine-dextromethorphan (PROMETHAZINE-DM) 6.25-15 MG/5ML syrup Take 5 mLs by mouth 4 (four) times daily as needed. 09/23/16   Sheliah Hatch, MD  spironolactone (ALDACTONE) 25 MG tablet TAKE ONE TABLET BY MOUTH DAILY 07/13/16   Sheliah Hatch, MD  triamcinolone ointment (KENALOG) 0.1 % Apply 1 application topically 2 (two) times daily. 03/05/16 03/05/17  Sheliah Hatch, MD    Family History Family History  Problem Relation Age of Onset  . Hypertension Mother   . Hypertension Maternal Grandmother     Social History Social History  Substance Use Topics  . Smoking status: Current Every Day Smoker    Packs/day: 0.50    Types: Cigarettes  . Smokeless tobacco: Never Used  .  Alcohol use Yes     Comment: 2x week; 1-2 glasses of wine     Allergies   Codeine; Pravastatin; and Albuterol   Review of Systems Review of Systems  Constitutional: Negative for chills and fever.  Respiratory: Negative for shortness of breath.   Cardiovascular: Negative for chest pain.  Musculoskeletal: Positive for arthralgias and myalgias.  Neurological: Negative for syncope, weakness, numbness and headaches.  All other systems reviewed and are negative.    Physical Exam Updated Vital Signs BP 136/90 (BP Location: Right Arm)   Pulse 66   Temp 98.3 F (36.8 C) (Oral)   Resp 18   LMP 12/20/2016    SpO2 96%   Physical Exam  Constitutional: She appears well-developed and well-nourished.  HENT:  Head: Normocephalic and atraumatic.  Eyes: Conjunctivae and EOM are normal. Pupils are equal, round, and reactive to light. Right eye exhibits no discharge. Left eye exhibits no discharge.  Neck: No JVD present. No tracheal deviation present.  Cardiovascular: Normal rate.   Pulmonary/Chest: Effort normal.  Abdominal: She exhibits no distension.  Musculoskeletal:  Limited range of motion of left wrist and digits due to pain in the left hand. Maximally tender along the middle of the fourth metacarpal. No deformity or crepitus noted. 5/5 strength of wrist, hand, and digits with flexion and extension against resistance. Left ankle with swelling noted laterally. Maximally tender along the lateral malleolus. No ligamentous laxity or varus/valgus instability noted. No calf deformity or tenderness to palpation along the Achilles tendon. Thompson test with plantarflexion on calf squeeze. Compartment soft 5/5 strength of bilateral lower extremity. Pain with ambulation.  Neurological: She is alert. No sensory deficit.  Fluent speech, no facial droop, sensation intact globally, unable to assess gait due to pain with ambulation  Skin: Skin is warm and dry. Capillary refill takes less than 2 seconds.  Psychiatric: She has a normal mood and affect. Her behavior is normal.     ED Treatments / Results  Labs (all labs ordered are listed, but only abnormal results are displayed) Labs Reviewed - No data to display  EKG  EKG Interpretation None       Radiology Dg Ankle Complete Left  Result Date: 12/21/2016 CLINICAL DATA:  Status post fall during which the patient felt and heard a pop in the ankle. Painful dorsum flexion. EXAM: LEFT ANKLE COMPLETE - 3+ VIEW COMPARISON:  None in PACs FINDINGS: The bones are subjectively adequately mineralized. There is no acute fracture or dislocation. The ankle joint  mortise is preserved. The talar dome is intact. There is soft tissue swelling anteriorly and medially. IMPRESSION: There is no acute fracture nor dislocation. There is soft tissue swelling. Electronically Signed   By: David  [place] M.D.   On: 12/21/2016 15:43   Dg Hand Complete Left  Result Date: 12/21/2016 CLINICAL DATA:  S post fall, generalize left hand pain. EXAM: LEFT HAND - COMPLETE 3+ VIEW COMPARISON:  None in PACs FINDINGS: The bones are subjectively adequately mineralized. The joint spaces are well maintained. There is subtle lucency projecting at the junction of the proximal and middle thirds of the fourth metacarpal shaft worrisome for a nondisplaced fracture. The adjacent metacarpals appear intact as do the phalanges. The carpal bones are intact. IMPRESSION: Probable nondisplaced fracture involving the midshaft of the fourth metacarpal. Electronically Signed   By: David  [place] M.D.   On: 12/21/2016 15:45    Procedures Procedures (including critical care time)  Medications Ordered in ED Medications - No data  to display   Initial Impression / Assessment and Plan / ED Course  I have reviewed the triage vital signs and the nursing notes.  Pertinent labs & imaging results that were available during my care of the patient were reviewed by me and considered in my medical decision making (see chart for details).     Patient with left ankle and left hand pain status post ground-level fall earlier today. Afebrile, vital signs stable, neurovascularly intact. Low suspicion of traumatic brain injury, intracranial hemorrhage, or cardiopulmonary abnormality. No complaints of back pain. X-ray of ankle shows no fracture or dislocation but soft tissue swelling noted. RICE therapy indicated and discussed with patient, as she was given an ASO ankle brace. She states she will use her husband's walker for ambulation which I think is reasonable. X-ray of left hand shows possible nondisplaced midshaft  fourth metacarpal fracture. We will brace with finger splint, discussed pain control and follow-up with hand surgery in one week for further evaluation. Discussed indications for return to the ED. Pt verbalized understanding of and agreement with plan and is safe for discharge home at this time.   Final Clinical Impressions(s) / ED Diagnoses   Final diagnoses:  Acute left ankle pain  Closed nondisplaced fracture of shaft of fourth metacarpal bone of left hand, initial encounter    New Prescriptions New Prescriptions   No medications on file     Bennye AlmFawze, Trejon Duford A, PA-C 12/21/16 1650    Azalia Bilisampos, Kevin, MD 12/21/16 2341

## 2016-12-21 NOTE — ED Triage Notes (Signed)
Pt tripped on steps approx 1245p-pain to left ankle and left hand-presents to triage in w/c-NAD

## 2016-12-30 ENCOUNTER — Other Ambulatory Visit: Payer: Self-pay | Admitting: Family Medicine

## 2017-02-03 ENCOUNTER — Other Ambulatory Visit: Payer: Self-pay | Admitting: Family Medicine

## 2017-02-14 ENCOUNTER — Other Ambulatory Visit: Payer: Self-pay | Admitting: Emergency Medicine

## 2017-02-14 ENCOUNTER — Ambulatory Visit (INDEPENDENT_AMBULATORY_CARE_PROVIDER_SITE_OTHER): Payer: Self-pay | Admitting: Physician Assistant

## 2017-02-14 ENCOUNTER — Encounter: Payer: Self-pay | Admitting: Physician Assistant

## 2017-02-14 VITALS — BP 122/90 | HR 67 | Temp 98.2°F | Resp 14 | Ht 66.0 in | Wt 188.0 lb

## 2017-02-14 DIAGNOSIS — J019 Acute sinusitis, unspecified: Secondary | ICD-10-CM

## 2017-02-14 DIAGNOSIS — B9689 Other specified bacterial agents as the cause of diseases classified elsewhere: Secondary | ICD-10-CM

## 2017-02-14 MED ORDER — DOXYCYCLINE HYCLATE 100 MG PO CAPS: 100.0000 mg | ORAL_CAPSULE | Freq: Two times a day (BID) | ORAL | 0 refills | Status: DC

## 2017-02-14 MED ORDER — CLONAZEPAM 1 MG PO TABS: 1.0000 mg | ORAL_TABLET | Freq: Two times a day (BID) | ORAL | 0 refills | Status: DC | PRN

## 2017-02-14 MED ORDER — METHYLPREDNISOLONE ACETATE 80 MG/ML IJ SUSP
80.0000 mg | Freq: Once | INTRAMUSCULAR | Status: AC
  Administered 2017-02-14: 80 mg via INTRAMUSCULAR

## 2017-02-14 NOTE — Progress Notes (Signed)
Pre visit review using our clinic review tool, if applicable. No additional management support is needed unless otherwise documented below in the visit note. 

## 2017-02-14 NOTE — Addendum Note (Signed)
Addended by: Con MemosMOORE, Lacharles Altschuler S on: 02/14/2017 12:04 PM   Modules accepted: Orders

## 2017-02-14 NOTE — Patient Instructions (Signed)
Please take antibiotic as directed.  Increase fluid intake.  Use Saline nasal spray.  Take a daily multivitamin. Use vaseline as directed to help protect and moisten the inside of your nose.  Place a humidifier in the bedroom.  Please call or return clinic if symptoms are not improving.  Sinusitis Sinusitis is redness, soreness, and swelling (inflammation) of the paranasal sinuses. Paranasal sinuses are air pockets within the bones of your face (beneath the eyes, the middle of the forehead, or above the eyes). In healthy paranasal sinuses, mucus is able to drain out, and air is able to circulate through them by way of your nose. However, when your paranasal sinuses are inflamed, mucus and air can become trapped. This can allow bacteria and other germs to grow and cause infection. Sinusitis can develop quickly and last only a short time (acute) or continue over a long period (chronic). Sinusitis that lasts for more than 12 weeks is considered chronic.  CAUSES  Causes of sinusitis include:  Allergies.  Structural abnormalities, such as displacement of the cartilage that separates your nostrils (deviated septum), which can decrease the air flow through your nose and sinuses and affect sinus drainage.  Functional abnormalities, such as when the small hairs (cilia) that line your sinuses and help remove mucus do not work properly or are not present. SYMPTOMS  Symptoms of acute and chronic sinusitis are the same. The primary symptoms are pain and pressure around the affected sinuses. Other symptoms include:  Upper toothache.  Earache.  Headache.  Bad breath.  Decreased sense of smell and taste.  A cough, which worsens when you are lying flat.  Fatigue.  Fever.  Thick drainage from your nose, which often is green and may contain pus (purulent).  Swelling and warmth over the affected sinuses. DIAGNOSIS  Your caregiver will perform a physical exam. During the exam, your caregiver  may:  Look in your nose for signs of abnormal growths in your nostrils (nasal polyps).  Tap over the affected sinus to check for signs of infection.  View the inside of your sinuses (endoscopy) with a special imaging device with a light attached (endoscope), which is inserted into your sinuses. If your caregiver suspects that you have chronic sinusitis, one or more of the following tests may be recommended:  Allergy tests.  Nasal culture A sample of mucus is taken from your nose and sent to a lab and screened for bacteria.  Nasal cytology A sample of mucus is taken from your nose and examined by your caregiver to determine if your sinusitis is related to an allergy. TREATMENT  Most cases of acute sinusitis are related to a viral infection and will resolve on their own within 10 days. Sometimes medicines are prescribed to help relieve symptoms (pain medicine, decongestants, nasal steroid sprays, or saline sprays).  However, for sinusitis related to a bacterial infection, your caregiver will prescribe antibiotic medicines. These are medicines that will help kill the bacteria causing the infection.  Rarely, sinusitis is caused by a fungal infection. In theses cases, your caregiver will prescribe antifungal medicine. For some cases of chronic sinusitis, surgery is needed. Generally, these are cases in which sinusitis recurs more than 3 times per year, despite other treatments. HOME CARE INSTRUCTIONS   Drink plenty of water. Water helps thin the mucus so your sinuses can drain more easily.  Use a humidifier.  Inhale steam 3 to 4 times a day (for example, sit in the bathroom with the shower running).  Apply a warm, moist washcloth to your face 3 to 4 times a day, or as directed by your caregiver.  Use saline nasal sprays to help moisten and clean your sinuses.  Take over-the-counter or prescription medicines for pain, discomfort, or fever only as directed by your caregiver. SEEK IMMEDIATE  MEDICAL CARE IF:  You have increasing pain or severe headaches.  You have nausea, vomiting, or drowsiness.  You have swelling around your face.  You have vision problems.  You have a stiff neck.  You have difficulty breathing. MAKE SURE YOU:   Understand these instructions.  Will watch your condition.  Will get help right away if you are not doing well or get worse. Document Released: 07/12/2005 Document Revised: 10/04/2011 Document Reviewed: 07/27/2011 [facility] Patient Information 2014 [place], [place].

## 2017-02-14 NOTE — Progress Notes (Signed)
Patient presents to clinic today c/o 1 week of progressively worsening sinus pressure, nasal congestion, sinus pain and facial pain associated with thick and dark nasal drainage. Denies fever, chills, shortness of breath. Denies recent travel or sick contact. Has used saline nasal rinse and flonase but stopped due to nasal irritation.   Past Medical History:  Diagnosis Date  . Anxiety   . Depression   . Hyperlipidemia   . Hypertension     Current Outpatient Prescriptions on File Prior to Visit  Medication Sig Dispense Refill  . clonazePAM (KLONOPIN) 1 MG tablet TAKE ONE TABLET BY MOUTH TWICE A DAY AS NEEDED FOR ANXIETY 30 tablet 0  . escitalopram (LEXAPRO) 10 MG tablet TAKE TWO TABLETS BY MOUTH DAILY 60 tablet 2  . fluticasone (FLONASE) 50 MCG/ACT nasal spray Place 2 sprays into both nostrils daily. 16 g 6  . spironolactone (ALDACTONE) 25 MG tablet TAKE ONE TABLET BY MOUTH DAILY 30 tablet 5  . triamcinolone ointment (KENALOG) 0.1 % Apply 1 application topically 2 (two) times daily. 80 g 1  . atorvastatin (LIPITOR) 20 MG tablet Take 1 tablet (20 mg total) by mouth daily. (Patient not taking: Reported on 02/14/2017) 30 tablet 6   No current facility-administered medications on file prior to visit.     Allergies  Allergen Reactions  . Codeine Nausea And Vomiting  . Pravastatin Other (See Comments)    Hair loss  . Albuterol Rash    Hives and itchy throat    Family History  Problem Relation Age of Onset  . Hypertension Mother   . Hypertension Maternal Grandmother     Social History   Social History  . Marital status: Married    Spouse name: N/A  . Number of children: N/A  . Years of education: N/A   Social History Main Topics  . Smoking status: Current Every Day Smoker    Packs/day: 0.50    Types: Cigarettes  . Smokeless tobacco: Never Used  . Alcohol use Yes     Comment: 2x week; 1-2 glasses of wine  . Drug use: No  . Sexual activity: Not Asked   Other Topics  Concern  . None   Social History Narrative  . None   Review of Systems - See HPI.  All other ROS are negative.  BP 122/90   Pulse 67   Temp 98.2 F (36.8 C) (Oral)   Resp 14   Ht 5\' 6"  (1.676 m)   Wt 188 lb (85.3 kg)   SpO2 98%   BMI 30.34 kg/m   Physical Exam  Constitutional: She is oriented to person, place, and time and well-developed, well-nourished, and in no distress.  HENT:  Head: Normocephalic and atraumatic.  Right Ear: Tympanic membrane normal.  Left Ear: Tympanic membrane normal.  Nose: Mucosal edema and rhinorrhea present. Right sinus exhibits maxillary sinus tenderness. Left sinus exhibits maxillary sinus tenderness.  Mouth/Throat: Uvula is midline, oropharynx is clear and moist and mucous membranes are normal.  Eyes: Conjunctivae are normal.  Neck: Neck supple.  Cardiovascular: Normal rate, regular rhythm, normal heart sounds and intact distal pulses.   Pulmonary/Chest: Effort normal and breath sounds normal. No respiratory distress. She has no wheezes. She has no rales. She exhibits no tenderness.  Neurological: She is alert and oriented to person, place, and time.  Skin: Skin is warm and dry. No rash noted.  Vitals reviewed.  Assessment/Plan: 1. Acute bacterial sinusitis Rx Doxycycline.  Increase fluids.  Rest.  Saline nasal  spray.  Probiotic.  Mucinex as directed.  Humidifier in bedroom. IM Depomedrol given today.  Call or return to clinic if symptoms are not improving.  - doxycycline (VIBRAMYCIN) 100 MG capsule; Take 1 capsule (100 mg total) by mouth 2 (two) times daily.  Dispense: 20 capsule; Refill: 0   Piedad ClimesMartin, Dan Dissinger Cody, [place]PA-C

## 2017-02-14 NOTE — Telephone Encounter (Signed)
Ok for #30, no refills.  Needs to schedule CPE 

## 2017-02-14 NOTE — Telephone Encounter (Signed)
Patient seen today by Selena Battenody. Patient requested refill of her Clonazepam.  Please advise

## 2017-03-15 ENCOUNTER — Encounter (HOSPITAL_COMMUNITY): Payer: Self-pay

## 2017-03-17 ENCOUNTER — Other Ambulatory Visit: Payer: Self-pay | Admitting: Obstetrics and Gynecology

## 2017-03-17 DIAGNOSIS — R921 Mammographic calcification found on diagnostic imaging of breast: Secondary | ICD-10-CM

## 2017-03-22 ENCOUNTER — Other Ambulatory Visit: Payer: Self-pay | Admitting: Obstetrics and Gynecology

## 2017-03-22 DIAGNOSIS — R921 Mammographic calcification found on diagnostic imaging of breast: Secondary | ICD-10-CM

## 2017-04-07 ENCOUNTER — Ambulatory Visit
Admission: RE | Admit: 2017-04-07 | Discharge: 2017-04-07 | Disposition: A | Discharge status: Home / Self Care | Payer: No Typology Code available for payment source | Source: Ambulatory Visit | Attending: Obstetrics and Gynecology | Admitting: Obstetrics and Gynecology

## 2017-04-07 ENCOUNTER — Encounter (HOSPITAL_COMMUNITY): Payer: Self-pay

## 2017-04-07 ENCOUNTER — Ambulatory Visit (HOSPITAL_COMMUNITY)
Admission: RE | Admit: 2017-04-07 | Discharge: 2017-04-07 | Disposition: A | Discharge status: Home / Self Care | Payer: Self-pay | Source: Ambulatory Visit | Attending: Obstetrics and Gynecology | Admitting: Obstetrics and Gynecology

## 2017-04-07 VITALS — BP 126/84 | HR 65 | Temp 97.7°F | Ht 65.0 in | Wt 189.8 lb

## 2017-04-07 DIAGNOSIS — Z1239 Encounter for other screening for malignant neoplasm of breast: Secondary | ICD-10-CM

## 2017-04-07 DIAGNOSIS — R921 Mammographic calcification found on diagnostic imaging of breast: Secondary | ICD-10-CM

## 2017-04-07 NOTE — Addendum Note (Signed)
Encounter addended by: Priscille HeidelbergBrannock, Carrie Usery P, RN on: 04/07/2017 12:35 PM<BR>    Actions taken: Sign clinical note

## 2017-04-07 NOTE — Progress Notes (Addendum)
[facility] due to recommending left breast six month follow-up. Last left breast diagnostic mammogram completed 09/23/2016.  Pap Smear: Pap smear not completed today. [facility] and normal with negative HPV. Per patient has a history of an abnormal Pap smear when she was 51 years old that cryotherapy was completed for follow-up. Patient states all Pap smears have been normal since cryotherapy was completed. Last Pap smear result is in EPIC.  Physical exam: Breasts Right breast slightly larger than left breast that per patient is normal for her. No skin abnormalities bilateral breasts. No nipple retraction bilateral breasts. No nipple discharge bilateral breasts. No lymphadenopathy. No lumps palpated bilateral breasts. No complaints of pain or tenderness on exam. [facility] for diagnostic mamm[facility] and left breast ultrasound per recommendation. Appointment scheduled for Thursday, April 07, 2017 at 0920.        Pelvic/Bimanual No Pap smear completed today since last Pap smear and HPV typing was 09/17/2016. Pap smear not indicated per BCCCP guidelines.   Smoking History: Patient is a current smoker. Discussed smoking cessation with patient. Referred patient to the [facility] and gave resources to free smoking cessation classes at [facility].  Patient Navigation: Patient education provided. Access to services provided for [facility] through [facility] program.

## 2017-04-07 NOTE — Patient Instructions (Signed)
Explained breast self awareness with Carla Torres. Patient did not need a Pap smear today due to last Pap smear and HPV typing was 09/17/2016. Let her know BCCCP will cover Pap smears and HPV typing every 5 years unless has a history of abnormal Pap smears. Referred [facility] to [facility] for diagnostic mammogram and left breast ultrasound per recommendation. Appointment scheduled for Thursday, April 07, 2017 at 0920. Patient aware of appointment and will be there.Discussed smoking cessation with patient. Referred [facility] to the [facility] and gave resources to free smoking cessation classes at [facility] Carla Torres verbalized understanding.  Brannock, Carla Maserhristine Poll, RN 10:19 AM

## 2017-04-27 ENCOUNTER — Other Ambulatory Visit: Payer: Self-pay | Admitting: Family Medicine

## 2017-04-28 ENCOUNTER — Other Ambulatory Visit: Payer: Self-pay | Admitting: Family Medicine

## 2017-04-28 NOTE — Telephone Encounter (Signed)
Last OV 01/2317 (martin-sinus) Clonazepam last filled 02/14/17 #30 with 0

## 2017-04-29 ENCOUNTER — Encounter: Payer: Self-pay | Admitting: Physician Assistant

## 2017-04-29 ENCOUNTER — Ambulatory Visit (INDEPENDENT_AMBULATORY_CARE_PROVIDER_SITE_OTHER): Payer: PRIVATE HEALTH INSURANCE | Admitting: Physician Assistant

## 2017-04-29 VITALS — BP 124/82 | HR 67 | Temp 98.6°F | Resp 14 | Ht 65.0 in | Wt 189.0 lb

## 2017-04-29 DIAGNOSIS — B9689 Other specified bacterial agents as the cause of diseases classified elsewhere: Secondary | ICD-10-CM

## 2017-04-29 DIAGNOSIS — J019 Acute sinusitis, unspecified: Secondary | ICD-10-CM

## 2017-04-29 MED ORDER — AMOXICILLIN-POT CLAVULANATE 875-125 MG PO TABS: 1.0000 | ORAL_TABLET | Freq: Two times a day (BID) | ORAL | 0 refills | Status: DC

## 2017-04-29 NOTE — Patient Instructions (Signed)
Please take antibiotic as directed.  Increase fluid intake.  Use Saline nasal spray.  Take a daily multivitamin. Start Flonase nasal spray over-the-counter. Delsym for cough.  Place a humidifier in the bedroom.  Please call or return clinic if symptoms are not improving.  Sinusitis Sinusitis is redness, soreness, and swelling (inflammation) of the paranasal sinuses. Paranasal sinuses are air pockets within the bones of your face (beneath the eyes, the middle of the forehead, or above the eyes). In healthy paranasal sinuses, mucus is able to drain out, and air is able to circulate through them by way of your nose. However, when your paranasal sinuses are inflamed, mucus and air can become trapped. This can allow bacteria and other germs to grow and cause infection. Sinusitis can develop quickly and last only a short time (acute) or continue over a long period (chronic). Sinusitis that lasts for more than 12 weeks is considered chronic.  CAUSES  Causes of sinusitis include:  Allergies.  Structural abnormalities, such as displacement of the cartilage that separates your nostrils (deviated septum), which can decrease the air flow through your nose and sinuses and affect sinus drainage.  Functional abnormalities, such as when the small hairs (cilia) that line your sinuses and help remove mucus do not work properly or are not present. SYMPTOMS  Symptoms of acute and chronic sinusitis are the same. The primary symptoms are pain and pressure around the affected sinuses. Other symptoms include:  Upper toothache.  Earache.  Headache.  Bad breath.  Decreased sense of smell and taste.  A cough, which worsens when you are lying flat.  Fatigue.  Fever.  Thick drainage from your nose, which often is green and may contain pus (purulent).  Swelling and warmth over the affected sinuses. DIAGNOSIS  Your caregiver will perform a physical exam. During the exam, your caregiver may:  Look in your nose  for signs of abnormal growths in your nostrils (nasal polyps).  Tap over the affected sinus to check for signs of infection.  View the inside of your sinuses (endoscopy) with a special imaging device with a light attached (endoscope), which is inserted into your sinuses. If your caregiver suspects that you have chronic sinusitis, one or more of the following tests may be recommended:  Allergy tests.  Nasal culture A sample of mucus is taken from your nose and sent to a lab and screened for bacteria.  Nasal cytology A sample of mucus is taken from your nose and examined by your caregiver to determine if your sinusitis is related to an allergy. TREATMENT  Most cases of acute sinusitis are related to a viral infection and will resolve on their own within 10 days. Sometimes medicines are prescribed to help relieve symptoms (pain medicine, decongestants, nasal steroid sprays, or saline sprays).  However, for sinusitis related to a bacterial infection, your caregiver will prescribe antibiotic medicines. These are medicines that will help kill the bacteria causing the infection.  Rarely, sinusitis is caused by a fungal infection. In theses cases, your caregiver will prescribe antifungal medicine. For some cases of chronic sinusitis, surgery is needed. Generally, these are cases in which sinusitis recurs more than 3 times per year, despite other treatments. HOME CARE INSTRUCTIONS   Drink plenty of water. Water helps thin the mucus so your sinuses can drain more easily.  Use a humidifier.  Inhale steam 3 to 4 times a day (for example, sit in the bathroom with the shower running).  Apply a warm, moist washcloth to  your face 3 to 4 times a day, or as directed by your caregiver.  Use saline nasal sprays to help moisten and clean your sinuses.  Take over-the-counter or prescription medicines for pain, discomfort, or fever only as directed by your caregiver. SEEK IMMEDIATE MEDICAL CARE IF:  You  have increasing pain or severe headaches.  You have nausea, vomiting, or drowsiness.  You have swelling around your face.  You have vision problems.  You have a stiff neck.  You have difficulty breathing. MAKE SURE YOU:   Understand these instructions.  Will watch your condition.  Will get help right away if you are not doing well or get worse. Document Released: 07/12/2005 Document Revised: 10/04/2011 Document Reviewed: 07/27/2011 [facility] Patient Information 2014 [place], [place].

## 2017-04-29 NOTE — Progress Notes (Signed)
Pre visit review using our clinic review tool, if applicable. No additional management support is needed unless otherwise documented below in the visit note. 

## 2017-04-29 NOTE — Progress Notes (Signed)
Patient presents to clinic today c/o 1 week of sinus pressure, sinus pain, PND, ear pressure/pain. Notes initially feeling better but symptoms have returned and are progressively worsening. Endorses cough that is somewhat productive of thick sputum. Denies recent travel or sick contact. Has been using saline nasal rinses to help. Endorses some low-grade fever off and on. Denies dental pain with symptoms.   Past Medical History:  Diagnosis Date  . Anxiety   . Depression   . Hyperlipidemia   . Hypertension     Current Outpatient Prescriptions on File Prior to Visit  Medication Sig Dispense Refill  . clonazePAM (KLONOPIN) 1 MG tablet TAKE ONE TABLET BY MOUTH TWO TIMES A DAY AS NEEDED FOR ANXIETY 30 tablet 0  . escitalopram (LEXAPRO) 10 MG tablet TAKE TWO TABLETS BY MOUTH DAILY 60 tablet 2  . fluticasone (FLONASE) 50 MCG/ACT nasal spray SPRAY TWO SPRAYS IN EACH NOSTRIL ONCE DAILY 16 g 0  . spironolactone (ALDACTONE) 25 MG tablet TAKE ONE TABLET BY MOUTH DAILY 30 tablet 5  . atorvastatin (LIPITOR) 20 MG tablet Take 1 tablet (20 mg total) by mouth daily. (Patient not taking: Reported on 02/14/2017) 30 tablet 6   No current facility-administered medications on file prior to visit.     Allergies  Allergen Reactions  . Codeine Nausea And Vomiting  . Pravastatin Other (See Comments)    Hair loss  . Albuterol Rash    Hives and itchy throat    Family History  Problem Relation Age of Onset  . Hypertension Mother   . Hypertension Maternal Grandmother     Social History   Social History  . Marital status: Married    Spouse name: N/A  . Number of children: N/A  . Years of education: N/A   Social History Main Topics  . Smoking status: Current Every Day Smoker    Packs/day: 0.50    Years: 30.00    Types: Cigarettes  . Smokeless tobacco: Never Used  . Alcohol use Yes     Comment: 2x week; 1-2 glasses of wine  . Drug use: No  . Sexual activity: No   Other Topics Concern  . None    Social History Narrative  . None   Review of Systems - See HPI.  All other ROS are negative.  BP 124/82   Pulse 67   Temp 98.6 F (37 C) (Oral)   Resp 14   Ht  (1.651 m)   Wt 189 lb (85.7 kg)   SpO2 98%   BMI 31.45 kg/m   Physical Exam  Constitutional: She is oriented to person, place, and time and well-developed, well-nourished, and in no distress.  HENT:  Head: Normocephalic and atraumatic.  Right Ear: A middle ear effusion (serous) is present.  Left Ear: Tympanic membrane normal.  Nose: Right sinus exhibits maxillary sinus tenderness. Left sinus exhibits maxillary sinus tenderness.  Mouth/Throat: Uvula is midline, oropharynx is clear and moist and mucous membranes are normal.  Eyes: Conjunctivae are normal.  Neck: Neck supple.  Cardiovascular: Normal rate, regular rhythm, normal heart sounds and intact distal pulses.   Pulmonary/Chest: Effort normal and breath sounds normal. No respiratory distress. She has no wheezes. She has no rales. She exhibits no tenderness.  Lymphadenopathy:    She has no cervical adenopathy.  Neurological: She is alert and oriented to person, place, and time.  Skin: Skin is warm and dry. No rash noted.  Psychiatric: Affect normal.  Vitals reviewed.  Assessment/Plan: Rx Augmentin.  Increase fluids.  Rest.  Saline nasal spray.  Probiotic.  Mucinex as directed.  Humidifier in bedroom. Start Flonase.  Call or return to clinic if symptoms are not improving.   Piedad Climes, PA-C

## 2017-07-02 ENCOUNTER — Other Ambulatory Visit: Payer: Self-pay | Admitting: Family Medicine

## 2017-07-06 NOTE — Telephone Encounter (Signed)
Last OV 09/23/16 (sore throat) Spironolactone last filled 12/30/16 #30 with 5

## 2017-07-06 NOTE — Telephone Encounter (Signed)
Pt is WAY overdue for appt.  Will give #30, no refills

## 2017-07-06 NOTE — Telephone Encounter (Signed)
Called pt and left a detailed message on voicemail to advise that spironolactone was filled for #30 with 0 refills. Per PCP pt needs a Blood Pressure follow up appointment for any further refills.

## 2017-07-31 ENCOUNTER — Other Ambulatory Visit: Payer: Self-pay | Admitting: Family Medicine

## 2017-08-02 ENCOUNTER — Ambulatory Visit: Payer: Self-pay | Admitting: Physician Assistant

## 2017-08-02 ENCOUNTER — Telehealth: Payer: Self-pay | Admitting: Physician Assistant

## 2017-08-02 ENCOUNTER — Encounter: Payer: Self-pay | Admitting: Physician Assistant

## 2017-08-02 VITALS — BP 118/80 | HR 58 | Temp 98.3°F | Resp 14 | Ht 65.0 in | Wt 196.0 lb

## 2017-08-02 DIAGNOSIS — F3181 Bipolar II disorder: Secondary | ICD-10-CM

## 2017-08-02 MED ORDER — FLUOXETINE HCL 20 MG PO TABS
20.0000 mg | ORAL_TABLET | Freq: Every day | ORAL | 3 refills | Status: DC
Start: 2017-08-02 — End: 2018-01-04

## 2017-08-02 MED ORDER — CLONAZEPAM 1 MG PO TABS: ORAL_TABLET | ORAL | 1 refills | Status: DC

## 2017-08-02 MED ORDER — SPIRONOLACTONE 25 MG PO TABS
25.0000 mg | ORAL_TABLET | Freq: Every day | ORAL | 0 refills | Status: DC
Start: 2017-08-02 — End: 2017-09-01
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
## 2017-08-02 NOTE — Patient Instructions (Signed)
Please stop the Lexapro and start the Fluoxetine daily as directed. Continue other medications as directed.  I am sending in refills for you.  Please contact [facility].  Follow-up with Dr. Beverely Lowabori in 4 weeks.   Return sooner if needed.

## 2017-08-02 NOTE — Telephone Encounter (Signed)
Copied from CRM #[id number]. Topic: Quick Communication - See Telephone Encounter >> Aug 02, 2017  2:48 PM Windy KalataMichael, Shalva Rozycki L, NT wrote: CRM for notification. See Telephone encounter for:  08/02/17.  Patient was seen today by Malva Coganody Martin and she states her good RX will not cover the fluoxetine tablet but will cover fluoxetine capsule and she would like for it be changed. Its about a $60 difference for patient. Please advise.

## 2017-08-03 ENCOUNTER — Other Ambulatory Visit: Payer: Self-pay | Admitting: Family Medicine

## 2017-08-03 NOTE — Telephone Encounter (Signed)
Spoke with the pharmacy and they are going to transfer over to the capsules.  Patient is aware.

## 2017-08-03 NOTE — Telephone Encounter (Signed)
Ok to change

## 2017-09-01 ENCOUNTER — Encounter: Payer: Self-pay | Admitting: General Practice

## 2017-09-01 ENCOUNTER — Other Ambulatory Visit: Payer: Self-pay | Admitting: Physician Assistant

## 2017-09-01 NOTE — Telephone Encounter (Signed)
Pt is overdue for BP appt.  Ok for #30, no refills w/o appt

## 2017-09-01 NOTE — Telephone Encounter (Signed)
Will defer further refills of patient's medications to PCP  

## 2017-09-01 NOTE — Telephone Encounter (Signed)
Noted left message and mychart to advise pt.

## 2017-09-28 ENCOUNTER — Other Ambulatory Visit: Payer: Self-pay | Admitting: Family Medicine

## 2017-09-29 ENCOUNTER — Encounter: Payer: Self-pay | Admitting: General Practice

## 2017-09-29 NOTE — Telephone Encounter (Signed)
Please advise[facility] Pt has not seen you since 09/2016

## 2017-09-29 NOTE — Telephone Encounter (Signed)
Ok for #30 but no further refills w/o BP f/u appt

## 2017-10-13 ENCOUNTER — Encounter: Payer: Self-pay | Admitting: Emergency Medicine

## 2017-10-17 ENCOUNTER — Encounter: Payer: Self-pay | Admitting: Physician Assistant

## 2017-10-17 ENCOUNTER — Ambulatory Visit: Payer: Self-pay | Admitting: Physician Assistant

## 2017-10-17 ENCOUNTER — Other Ambulatory Visit: Payer: Self-pay

## 2017-10-17 VITALS — BP 134/82 | HR 70 | Temp 98.0°F | Resp 16 | Ht 65.0 in | Wt 193.0 lb

## 2017-10-17 DIAGNOSIS — J019 Acute sinusitis, unspecified: Secondary | ICD-10-CM

## 2017-10-17 DIAGNOSIS — B9689 Other specified bacterial agents as the cause of diseases classified elsewhere: Secondary | ICD-10-CM

## 2017-10-17 MED ORDER — BENZONATATE 100 MG PO CAPS: 100.0000 mg | ORAL_CAPSULE | Freq: Two times a day (BID) | ORAL | 0 refills | Status: DC | PRN

## 2017-10-17 MED ORDER — DOXYCYCLINE HYCLATE 100 MG PO CAPS: 100.0000 mg | ORAL_CAPSULE | Freq: Two times a day (BID) | ORAL | 0 refills | Status: DC

## 2017-10-17 MED ORDER — FLUTICASONE PROPIONATE 50 MCG/ACT NA SUSP
2.0000 | Freq: Every day | NASAL | 6 refills | Status: DC
Start: 2017-10-17 — End: 2018-01-04

## 2017-10-17 NOTE — Progress Notes (Signed)
Patient presents to clinic today c/o 5 days of sinus pressure, nasal congestion, sinus pain, now with cough that is productive of green sputum and chest wall tenderness. Denies fever but notes significant fatigue. Notes sore throat but states this is minor. Has been taking Mucinex and taking her allergy medications daily. Has ran out of her Flonase.   Past Medical History:  Diagnosis Date  . Anxiety   . Depression   . Hyperlipidemia   . Hypertension     Current Outpatient Medications on File Prior to Visit  Medication Sig Dispense Refill  . clonazePAM (KLONOPIN) 1 MG tablet TAKE ONE TABLET BY MOUTH TWO TIMES A DAY AS NEEDED FOR ANXIETY 30 tablet 1  . FLUoxetine (PROZAC) 20 MG tablet Take 1 tablet (20 mg total) by mouth daily. 30 tablet 3  . fluticasone (FLONASE) 50 MCG/ACT nasal spray SPRAY TWO SPRAYS IN EACH NOSTRIL ONCE DAILY 16 g 5  . spironolactone (ALDACTONE) 25 MG tablet TAKE ONE TABLET BY MOUTH DAILY 30 tablet 0   No current facility-administered medications on file prior to visit.     Allergies  Allergen Reactions  . Codeine Nausea And Vomiting  . Pravastatin Other (See Comments)    Hair loss  . Albuterol Rash    Hives and itchy throat    Family History  Problem Relation Age of Onset  . Hypertension Mother   . Hypertension Maternal Grandmother     Social History   Socioeconomic History  . Marital status: Married    Spouse name: Not on file  . Number of children: Not on file  . Years of education: Not on file  . Highest education level: Not on file  Occupational History  . Not on file  Social Needs  . Financial resource strain: Not on file  . Food insecurity:    Worry: Not on file    Inability: Not on file  . Transportation needs:    Medical: Not on file    Non-medical: Not on file  Tobacco Use  . Smoking status: Current Every Day Smoker    Packs/day: 0.25    Years: 30.00    Pack years: 7.50    Types: Cigarettes  . Smokeless tobacco: Never Used    Substance and Sexual Activity  . Alcohol use: Yes    Comment: 2x week; 1-2 glasses of wine  . Drug use: No  . Sexual activity: Never  Lifestyle  . Physical activity:    Days per week: Not on file    Minutes per session: Not on file  . Stress: Not on file  Relationships  . Social connections:    Talks on phone: Not on file    Gets together: Not on file    Attends religious service: Not on file    Active member of club or organization: Not on file    Attends meetings of clubs or organizations: Not on file    Relationship status: Not on file  Other Topics Concern  . Not on file  Social History Narrative  . Not on file   Review of Systems - See HPI.  All other ROS are negative.  BP 134/82   Pulse 70   Temp 98 F (36.7 C) (Oral)   Resp 16   Ht 5\' 5"  (1.651 m)   Wt 193 lb (87.5 kg)   SpO2 97%   BMI 32.12 kg/m   Physical Exam  Constitutional: She is oriented to person, place, and time and  well-developed, well-nourished, and in no distress.  HENT:  Head: Normocephalic and atraumatic.  Right Ear: Tympanic membrane normal.  Left Ear: Tympanic membrane normal.  Nose: Mucosal edema and rhinorrhea present. Right sinus exhibits frontal sinus tenderness. Left sinus exhibits frontal sinus tenderness.  Mouth/Throat: Uvula is midline, oropharynx is clear and moist and mucous membranes are normal.  Eyes: Conjunctivae are normal.  Neck: Neck supple.  Cardiovascular: Normal rate, regular rhythm, normal heart sounds and intact distal pulses.  Pulmonary/Chest: Effort normal and breath sounds normal. No respiratory distress. She has no wheezes. She has no rales. She exhibits no tenderness.  Neurological: She is alert and oriented to person, place, and time.  Skin: Skin is warm and dry. No rash noted.  Psychiatric: Affect normal.  Vitals reviewed.  Assessment/Plan: 1. Acute bacterial sinusitis Rx Doxycycline.  Increase fluids.  Rest.  Saline nasal spray.  Probiotic.  Mucinex as  directed.  Humidifier in bedroom. Resume Flonase. Tessalon per orders.  Call or return to clinic if symptoms are not improving.  - doxycycline (VIBRAMYCIN) 100 MG capsule; Take 1 capsule (100 mg total) by mouth 2 (two) times daily.  Dispense: 20 capsule; Refill: 0 - benzonatate (TESSALON) 100 MG capsule; Take 1 capsule (100 mg total) by mouth 2 (two) times daily as needed for cough.  Dispense: 20 capsule; Refill: 0   Piedad Climes, PA-C

## 2017-10-17 NOTE — Patient Instructions (Signed)
Please take antibiotic as directed.  Increase fluid intake.  Use Saline nasal spray.  Take a daily multivitamin. Restart Flonase. Start Tessalon as directed.  Place a humidifier in the bedroom.  Please call or return clinic if symptoms are not improving.  Sinusitis Sinusitis is redness, soreness, and swelling (inflammation) of the paranasal sinuses. Paranasal sinuses are air pockets within the bones of your face (beneath the eyes, the middle of the forehead, or above the eyes). In healthy paranasal sinuses, mucus is able to drain out, and air is able to circulate through them by way of your nose. However, when your paranasal sinuses are inflamed, mucus and air can become trapped. This can allow bacteria and other germs to grow and cause infection. Sinusitis can develop quickly and last only a short time (acute) or continue over a long period (chronic). Sinusitis that lasts for more than 12 weeks is considered chronic.  CAUSES  Causes of sinusitis include:  Allergies.  Structural abnormalities, such as displacement of the cartilage that separates your nostrils (deviated septum), which can decrease the air flow through your nose and sinuses and affect sinus drainage.  Functional abnormalities, such as when the small hairs (cilia) that line your sinuses and help remove mucus do not work properly or are not present. SYMPTOMS  Symptoms of acute and chronic sinusitis are the same. The primary symptoms are pain and pressure around the affected sinuses. Other symptoms include:  Upper toothache.  Earache.  Headache.  Bad breath.  Decreased sense of smell and taste.  A cough, which worsens when you are lying flat.  Fatigue.  Fever.  Thick drainage from your nose, which often is green and may contain pus (purulent).  Swelling and warmth over the affected sinuses. DIAGNOSIS  Your caregiver will perform a physical exam. During the exam, your caregiver may:  Look in your nose for signs of  abnormal growths in your nostrils (nasal polyps).  Tap over the affected sinus to check for signs of infection.  View the inside of your sinuses (endoscopy) with a special imaging device with a light attached (endoscope), which is inserted into your sinuses. If your caregiver suspects that you have chronic sinusitis, one or more of the following tests may be recommended:  Allergy tests.  Nasal culture A sample of mucus is taken from your nose and sent to a lab and screened for bacteria.  Nasal cytology A sample of mucus is taken from your nose and examined by your caregiver to determine if your sinusitis is related to an allergy. TREATMENT  Most cases of acute sinusitis are related to a viral infection and will resolve on their own within 10 days. Sometimes medicines are prescribed to help relieve symptoms (pain medicine, decongestants, nasal steroid sprays, or saline sprays).  However, for sinusitis related to a bacterial infection, your caregiver will prescribe antibiotic medicines. These are medicines that will help kill the bacteria causing the infection.  Rarely, sinusitis is caused by a fungal infection. In theses cases, your caregiver will prescribe antifungal medicine. For some cases of chronic sinusitis, surgery is needed. Generally, these are cases in which sinusitis recurs more than 3 times per year, despite other treatments. HOME CARE INSTRUCTIONS   Drink plenty of water. Water helps thin the mucus so your sinuses can drain more easily.  Use a humidifier.  Inhale steam 3 to 4 times a day (for example, sit in the bathroom with the shower running).  Apply a warm, moist washcloth to your face  3 to 4 times a day, or as directed by your caregiver.  Use saline nasal sprays to help moisten and clean your sinuses.  Take over-the-counter or prescription medicines for pain, discomfort, or fever only as directed by your caregiver. SEEK IMMEDIATE MEDICAL CARE IF:  You have increasing  pain or severe headaches.  You have nausea, vomiting, or drowsiness.  You have swelling around your face.  You have vision problems.  You have a stiff neck.  You have difficulty breathing. MAKE SURE YOU:   Understand these instructions.  Will watch your condition.  Will get help right away if you are not doing well or get worse. Document Released: 07/12/2005 Document Revised: 10/04/2011 Document Reviewed: 07/27/2011 [facility] Patient Information 2014 [place], [place].

## 2017-10-18 ENCOUNTER — Encounter: Payer: Self-pay | Admitting: Physician Assistant

## 2017-10-25 ENCOUNTER — Other Ambulatory Visit: Payer: Self-pay | Admitting: Family Medicine

## 2017-11-22 ENCOUNTER — Other Ambulatory Visit: Payer: Self-pay | Admitting: Physician Assistant

## 2017-11-23 ENCOUNTER — Encounter: Payer: Self-pay | Admitting: General Practice

## 2017-11-23 NOTE — Telephone Encounter (Signed)
Last OV 10/17/17 (Acute Bacterial Sinusitis), No Future OV  Last filled 08/02/17, #30 with 3 refills

## 2017-11-23 NOTE — Telephone Encounter (Signed)
Will give 30 but no further refills until pt has appt scheduled w/ me (has been seeing Selena Batten recently and is overdue for CPE)

## 2017-11-23 NOTE — Telephone Encounter (Signed)
Noted, called pt could not leave a message. Mychart also sent to advise.

## 2017-12-14 ENCOUNTER — Other Ambulatory Visit: Payer: Self-pay | Admitting: Family Medicine

## 2017-12-20 ENCOUNTER — Other Ambulatory Visit: Payer: Self-pay | Admitting: Family Medicine

## 2017-12-27 DIAGNOSIS — R87619 Unspecified abnormal cytological findings in specimens from cervix uteri: Secondary | ICD-10-CM | POA: Insufficient documentation

## 2018-01-03 NOTE — Progress Notes (Signed)
Subjective: [id number] care, Anxiety/ depression HPI: Carla Torres is a 52 y.o. female presenting to clinic today for:  1. Anxiety/ Depression Patient with a long-standing history of anxiety and depression.  She reports that she was diagnosed in her late 76s.  She was previously on Lexapro but notes that she was having adverse side effects from the medication, including weight gain and therefore was transitioned over to Prozac.  She had been doing very well on the medication until most recently when she separated from her husband.  She notes that she lost her job in home and is now residing with her parents, which she finds stressful.  She has a daughter whom she cares for.  She notes that anxiety depression has been much more prominent as of late.  She is interested in going up on her Prozac dose.  Additionally, she takes Klonopin as needed.  She reports very seldom use up until recently.  Denies adverse side effects, including excessive sedation.  She notes the last dose was about 3 weeks ago, as she is run out of medication.  She used to see a psychiatrist but lost her insurance and therefore no longer sees them.  She notes that DSS is actually assisting her in setting up a counselor.  She does report a history of hospitalization for major depressive disorder with suicide attempt via overdose in 2017.  She notes that she had gone off of her SSRI at that time and had become severely depressed.  She was hospitalized at [facility] for about a week.  Denies any SI, HI, visual or auditory hallucinations.  She does note depressed mood, difficulty sleeping alternating with excessive sleeping, poor concentration and intermittent chest tightness related to anxiety.  Family history significant for depression in her mother.  Patient denies drug use.  She does drink a glass of wine occasionally during the week.  She is a 1 pack/day smoker and has been smoking for about 32 years.  2.   Preventative care Patient reports that she gets her Pap smears and mammograms performed at [facility].  Last evaluation was about 1 year ago.  She does note that her left breast had some irregularities that required follow-up.  She plans to schedule with them soon.  She has never had a screening colonoscopy.  She does note that she had a colectomy and appendectomy for a " mass" appreciated in her colon that ended up being infectious not cancerous.  Denies any rectal bleeding but does note intermittent abdominal pain related to what she thinks is scar tissue.  Bowel movements are normal.  Past Medical History:  Diagnosis Date  . Anxiety   . Depression   . Hyperlipidemia   . Hypertension    Past Surgical History:  Procedure Laterality Date  . APPENDECTOMY    . CHOLECYSTECTOMY    . COLON RESECTION    . COLON SURGERY  1995  . EXPLORATION POST OPERATIVE OPEN HEART  1969  . MASTECTOMY    . PATENT DUCTUS ARTERIOUS REPAIR     Social History   Socioeconomic History  . Marital status: Legally Separated    Spouse name: Not on file  . Number of children: Not on file  . Years of education: Not on file  . Highest education level: Not on file  Occupational History  . Not on file  Social Needs  . Financial resource strain: Not on file  . Food insecurity:    Worry: Not on  file    Inability: Not on file  . Transportation needs:    Medical: Not on file    Non-medical: Not on file  Tobacco Use  . Smoking status: Current Every Day Smoker    Packs/day: 0.25    Years: 30.00    Pack years: 7.50    Types: Cigarettes  . Smokeless tobacco: Never Used  Substance and Sexual Activity  . Alcohol use: Yes    Comment: 2x week; 1-2 glasses of wine  . Drug use: No  . Sexual activity: Never  Lifestyle  . Physical activity:    Days per week: Not on file    Minutes per session: Not on file  . Stress: Not on file  Relationships  . Social connections:    Talks on phone: Not on file    Gets  together: Not on file    Attends religious service: Not on file    Active member of club or organization: Not on file    Attends meetings of clubs or organizations: Not on file    Relationship status: Not on file  . Intimate partner violence:    Fear of current or ex partner: Not on file    Emotionally abused: Not on file    Physically abused: Not on file    Forced sexual activity: Not on file  Other Topics Concern  . Not on file  Social History Narrative  . Not on file   No outpatient medications have been marked as taking for the 01/04/18 encounter (Appointment) with Raliegh Ip, DO.   Family History  Problem Relation Age of Onset  . Hypertension Mother   . Hypertension Maternal Grandmother    Allergies  Allergen Reactions  . Codeine Nausea And Vomiting  . Pravastatin Other (See Comments)    Hair loss  . Albuterol Rash    Hives and itchy throat     Health Maintenance: Colon CA screening  ROS: Per HPI  Objective: Office vital signs reviewed. BP 138/80   Pulse 69   Temp 97.9 F (36.6 C) (Oral)   Ht 5\' 5"  (1.651 m)   Wt 186 lb (84.4 kg)   BMI 30.95 kg/m   Physical Examination:  General: Awake, alert, well nourished, No acute distress HEENT: Normal    Neck: No masses palpated. No lymphadenopathy    Eyes: PERRLA, extraocular movement in tact, sclera white    Nose: nasal turbinates moist, no nasal discharge    Throat: moist mucus membranes Cardio: regular rate and rhythm, S1S2 heard, no murmurs appreciated Pulm: clear to auscultation bilaterally, no wheezes, rhonchi or rales; normal work of breathing on room air Extremities: warm, well perfused, No edema, cyanosis or clubbing; +2 pulses bilaterally MSK: normal gait and normal station Skin: dry; intact; no rashes or lesions Neuro: No focal neurologic deficits Psych: Mood stable, speech somewhat pressured, good eye contact, affect appropriate, does not appear to be responding to internal  stimuli.  Depression screen [facility] 2/9 02/14/2017 03/05/2016 03/05/2016  Decreased Interest 0 - 0  Down, Depressed, Hopeless 0 1 0  PHQ - 2 Score 0 1 0  Altered sleeping 0 - -  Tired, decreased energy 0 - -  Change in appetite 0 - -  Feeling bad or failure about yourself  0 - -  Trouble concentrating 0 - -  Moving slowly or fidgety/restless 0 - -  Suicidal thoughts 0 - -  PHQ-9 Score 0 - -   GAD 7 : Generalized Anxiety Score  01/04/2018  Nervous, Anxious, on Edge 3  Control/stop worrying 3  Worry too much - different things 2  Trouble relaxing 1  Restless 1  Easily annoyed or irritable 2  Afraid - awful might happen 2  Total GAD 7 Score 14  Anxiety Difficulty Extremely difficult    Assessment/ Plan: 52 y.o. female   1. Bipolar II disorder, most recent episode major depressive (HCC) She has bipolar disorder listed in her records.  Unsure if this was a diagnosis by PCP or psychiatry.  Patient may be better served by a medication like Latuda instead of Prozac.  However, since she reports having had good response to Prozac in the past, will continue medication at an increased dose of 30 mg/day.  She will take one 20 mg and one 10 mg Prozac capsule to total 30 mg daily.  I have highly recommended that she see psychiatry going forward, particularly given her substantial history.  She certainly does not appear to be a danger to herself or others at this time.  I have placed the referral and encouraged patient to also contact a therapist and establish care.  Crisis hotlines provided to the patient.  Follow-up in 1 month. - Ambulatory referral to Psychiatry  2. Anxiety state I have refilled her Klonopin x1 month.  Patient to use this medication sparingly.  Avoid alcohol use.  Controlled substance contract signed today.  Urine drug screen obtained today.  Would prefer that she be evaluated by psychiatry prior to continuing this medication.  Would appreciate their input. - Ambulatory referral to  Psychiatry - ToxASSURE Select 13 (MW), Urine  3. Encounter to establish care with new doctor  4. Colon cancer screening - Ambulatory referral to Gastroenterology  5. Tobacco use disorder Contemplative.  The Narcotic Database has been reviewed.  There were no red flags.     Raliegh IpAshly M Gottschalk, DO [facility] [phone]

## 2018-01-04 ENCOUNTER — Ambulatory Visit: Payer: Medicaid Other | Admitting: Family Medicine

## 2018-01-04 ENCOUNTER — Encounter: Payer: Self-pay | Admitting: Family Medicine

## 2018-01-04 ENCOUNTER — Telehealth: Payer: Self-pay

## 2018-01-04 VITALS — BP 138/80 | HR 69 | Temp 97.9°F | Ht 65.0 in | Wt 186.0 lb

## 2018-01-04 DIAGNOSIS — Z7689 Persons encountering health services in other specified circumstances: Secondary | ICD-10-CM | POA: Diagnosis not present

## 2018-01-04 DIAGNOSIS — F3181 Bipolar II disorder: Secondary | ICD-10-CM

## 2018-01-04 DIAGNOSIS — Z1211 Encounter for screening for malignant neoplasm of colon: Secondary | ICD-10-CM | POA: Diagnosis not present

## 2018-01-04 DIAGNOSIS — F17200 Nicotine dependence, unspecified, uncomplicated: Secondary | ICD-10-CM | POA: Diagnosis not present

## 2018-01-04 DIAGNOSIS — F411 Generalized anxiety disorder: Secondary | ICD-10-CM | POA: Diagnosis not present

## 2018-01-04 MED ORDER — FLUOXETINE HCL 10 MG PO CAPS: 10.0000 mg | ORAL_CAPSULE | Freq: Every day | ORAL | 1 refills | Status: DC

## 2018-01-04 MED ORDER — FLUTICASONE PROPIONATE 50 MCG/ACT NA SUSP: 2.0000 | Freq: Every day | NASAL | 6 refills | Status: DC

## 2018-01-04 MED ORDER — CLONAZEPAM 1 MG PO TABS: ORAL_TABLET | ORAL | 0 refills | Status: DC

## 2018-01-04 MED ORDER — SPIRONOLACTONE 25 MG PO TABS: 25.0000 mg | ORAL_TABLET | Freq: Every day | ORAL | 1 refills | Status: DC

## 2018-01-04 MED ORDER — FLUOXETINE HCL 20 MG PO TABS: 20.0000 mg | ORAL_TABLET | Freq: Every day | ORAL | 1 refills | Status: DC
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
## 2018-01-04 NOTE — Patient Instructions (Signed)
Controlled Substance Guidelines:  1. You cannot get an early refill, even it is lost.  2. You cannot get pain medications from any other doctor, unless it is the emergency department and related to a new problem or injury.  3. You cannot use alcohol, marijuana, cocaine or any other recreational drugs while using this medication. This is very dangerous.  4. You are willing to have your urine drug tested at each visit.  5. You will not drive while using this medication, because that can put yourself and others in serious danger of an accident. 6. If any medication is stolen, then there must be a police report to verify it, or it cannot be refilled.  7. I will not prescribe these medications for longer than 3 months.  8. You must bring your pill bottle to each visit.  9. You must use the same pharmacy for all refills for the medication, unless you clear it with me beforehand.  10. You cannot share or sell this medication.    Your provider wants you to schedule an appointment with a Psychologist/Psychiatrist. The following list of offices requires the patient to call and make their own appointment, as there is information they need that only you can provide. Please feel free to choose form the following providers:  [facility]   [phone] Crisis Recovery [place] [phone]  [facility]  [phone]   [address] [place], [place]  (Scheduled through [facility]) Must call and do an interview for appointment. Sees Children / Accepts Medicaid  [facility]    [phone]  [address]    [place], [place]       [facility]  [phone] [address] [place], [place]  Evaluates for Autism but does not treat it Sees Children / Accepts Medicaid  [facility]    [phone] [address], [address]   [place], [place] Medication management, substance abuse, bipolar, grief, family, marriage, OCD, anxiety,  PTSD Sees children / Accepts Medicaid  [facility]    [phone] [address] [place], [place] Sees children / Accepts [facility]  [facility]  [phone] [address] [place], [place]   Dr Estelle GrumblesAkinlayo     [phone] [address] [place], [place]  Sees ADD & ADHD for treatment Accepts Medicaid  [facility]  [phone] [place], [place] Evaluates for Autism Accepts [facility]  [facility]  [phone] [address] [place], [place]  Does Adult ADD evaluations Does not accept Medicaid  [facility]   [phone] [address]   [place], [place] Uses animal therapy  Sees children as young as 51 years old [facility] [facility]  [facility]     [phone]    [address]  [place], [place] [phone] Sees children Accepts Medicaid

## 2018-01-07 ENCOUNTER — Other Ambulatory Visit: Payer: Self-pay | Admitting: Family Medicine

## 2018-01-09 NOTE — Telephone Encounter (Signed)
Last OV 10/17/17 (Acute Bacterial Sinusitis), No future OV  Last filled by Dr. Nadine CountsGottschalk on 01/04/18, #30 with 1 refill  I see your note to start the Prozac 20 mg but I am not sure if you are going to start filling or is this patient going to continue going to Dr. Nadine CountsGottschalk?

## 2018-01-10 LAB — TOXASSURE SELECT 13 (MW), URINE

## 2018-01-18 ENCOUNTER — Telehealth: Payer: Self-pay

## 2018-01-18 NOTE — Telephone Encounter (Signed)
Left Msg  
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
## 2018-02-03 ENCOUNTER — Encounter: Payer: Self-pay | Admitting: Family Medicine

## 2018-02-15 ENCOUNTER — Ambulatory Visit: Payer: Medicaid Other | Admitting: Family Medicine

## 2018-02-15 ENCOUNTER — Encounter: Payer: Self-pay | Admitting: Family Medicine

## 2018-02-15 VITALS — BP 129/80 | HR 65 | Temp 97.9°F | Ht 65.0 in | Wt 188.0 lb

## 2018-02-15 DIAGNOSIS — F3181 Bipolar II disorder: Secondary | ICD-10-CM

## 2018-02-15 DIAGNOSIS — F419 Anxiety disorder, unspecified: Secondary | ICD-10-CM | POA: Diagnosis not present
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
## 2018-02-15 NOTE — Progress Notes (Signed)
Subjective: CC: f/u GAD/ depresion PCP: Raliegh IpGottschalk, Laverne Klugh M, DO JYN:WGNFAHPI:Carla Torres is a 52 y.o. female presenting to clinic today for:  1. Depression/ GAD Patient was seen 6 weeks ago for depression and anxiety.  Her Prozac was increased to 30 mg daily and Klonopin was refilled for persistent anxiety.  She reports that symptoms have not changed with increased dose of Prozac.  She uses Klonopin 1-2 times per week.  She notes she has plenty of medication left over and does not need a refill at this time.  Denies excessive sedation.  She reports persistent anxiety and stress.  She did start a new job but states that stress continues because she is residing with her parents and is ready to get back into her own house.  Denies any SI, HI, visual or auditory hallucinations.  She has an appointment with Dr. Vanetta ShawlHisada scheduled for 02/22/2018 for continued management.   ROS: Per HPI  Allergies  Allergen Reactions  . Codeine Nausea And Vomiting  . Pravastatin Other (See Comments)    Hair loss  . Albuterol Rash    Hives and itchy throat   Past Medical History:  Diagnosis Date  . Anxiety   . Depression   . Hyperlipidemia   . Hypertension     Current Outpatient Medications:  .  clonazePAM (KLONOPIN) 1 MG tablet, TAKE ONE TABLET BY MOUTH TWO TIMES A DAY AS NEEDED FOR ANXIETY, Disp: 30 tablet, Rfl: 0 .  FLUoxetine (PROZAC) 10 MG capsule, Take 1 capsule (10 mg total) by mouth daily. (Take w/ 20mg  capsule to total 30mg  daily), Disp: 30 capsule, Rfl: 1 .  FLUoxetine (PROZAC) 20 MG tablet, Take 1 tablet (20 mg total) by mouth daily. (take w/ 10mg  capsule to total 30mg  daily), Disp: 30 tablet, Rfl: 1 .  fluticasone (FLONASE) 50 MCG/ACT nasal spray, Place 2 sprays into both nostrils daily., Disp: 16 g, Rfl: 6 .  spironolactone (ALDACTONE) 25 MG tablet, Take 1 tablet (25 mg total) by mouth daily., Disp: 30 tablet, Rfl: 1 Social History   Socioeconomic History  . Marital status: Legally Separated   Spouse name: Not on file  . Number of children: Not on file  . Years of education: Not on file  . Highest education level: Not on file  Occupational History  . Not on file  Social Needs  . Financial resource strain: Not on file  . Food insecurity:    Worry: Not on file    Inability: Not on file  . Transportation needs:    Medical: Not on file    Non-medical: Not on file  Tobacco Use  . Smoking status: Current Every Day Smoker    Packs/day: 1.00    Years: 30.00    Pack years: 30.00    Types: Cigarettes  . Smokeless tobacco: Never Used  Substance and Sexual Activity  . Alcohol use: Yes    Comment: 2x week; 1-2 glasses of wine  . Drug use: No  . Sexual activity: Never  Lifestyle  . Physical activity:    Days per week: Not on file    Minutes per session: Not on file  . Stress: Not on file  Relationships  . Social connections:    Talks on phone: Not on file    Gets together: Not on file    Attends religious service: Not on file    Active member of club or organization: Not on file    Attends meetings of clubs or organizations: Not  on file    Relationship status: Not on file  . Intimate partner violence:    Fear of current or ex partner: Not on file    Emotionally abused: Not on file    Physically abused: Not on file    Forced sexual activity: Not on file  Other Topics Concern  . Not on file  Social History Narrative  . Not on file   Family History  Problem Relation Age of Onset  . Hypertension Mother   . Hypertension Maternal Grandmother     Objective: Office vital signs reviewed. BP 129/80   Pulse 65   Temp 97.9 F (36.6 C) (Oral)   Ht 5\' 5"  (1.651 m)   Wt 188 lb (85.3 kg)   BMI 31.28 kg/m   Physical Examination:  General: Awake, alert, well nourished, No acute distress Psych: Mood stable, speech normal, affect appropriate, does not appear to be responding to internal stimuli.  Good eye contact. Depression screen [facility] 2/9 02/15/2018 01/04/2018 02/14/2017    Decreased Interest 1 3 0  Down, Depressed, Hopeless 2 3 0  PHQ - 2 Score 3 6 0  Altered sleeping 2 3 0  Tired, decreased energy 2 3 0  Change in appetite 2 3 0  Feeling bad or failure about yourself  2 - 0  Trouble concentrating 1 2 0  Moving slowly or fidgety/restless 1 2 0  Suicidal thoughts 0 1 0  PHQ-9 Score 13 20 0  Difficult doing work/chores Somewhat difficult - -   GAD 7 : Generalized Anxiety Score 02/15/2018 01/04/2018  Nervous, Anxious, on Edge 2 3  Control/stop worrying 1 3  Worry too much - different things 1 2  Trouble relaxing 2 1  Restless 0 1  Easily annoyed or irritable 1 2  Afraid - awful might happen 1 2  Total GAD 7 Score 8 14  Anxiety Difficulty Somewhat difficult Extremely difficult     Assessment/ Plan: 52 y.o. female   1. Bipolar II disorder, most recent episode major depressive (HCC) Again, I do suspect that patient would more likely benefit from an atypical antipsychotic/Latuda.  Her symptoms subjectively have not significantly improved with increased dose of Prozac.  However, both PHQ 9 and gad 7 have decreased since last visit.  She is using the Klonopin sparingly.  I did discuss with her that I would like her to keep her appointment with psychiatry, as I suspect that she will likely need change in medication at some point.  I would greatly appreciate psychiatry's input and assessment of patient,, particularly given psychiatric history and intolerance to many medications.  2. Anxiety As above.   Raliegh Ip, DO [facility] [phone]

## 2018-02-22 ENCOUNTER — Encounter (HOSPITAL_COMMUNITY): Payer: Self-pay | Admitting: Psychiatry

## 2018-02-22 ENCOUNTER — Ambulatory Visit (INDEPENDENT_AMBULATORY_CARE_PROVIDER_SITE_OTHER): Payer: Medicaid Other | Admitting: Psychiatry

## 2018-02-22 VITALS — BP 123/82 | HR 64 | Ht 65.0 in | Wt 186.0 lb

## 2018-02-22 DIAGNOSIS — F331 Major depressive disorder, recurrent, moderate: Secondary | ICD-10-CM

## 2018-02-22 DIAGNOSIS — F4310 Post-traumatic stress disorder, unspecified: Secondary | ICD-10-CM | POA: Diagnosis not present

## 2018-02-22 LAB — TSH: TSH: 1.89 mIU/L

## 2018-02-22 MED ORDER — CLONAZEPAM 1 MG PO TABS: 1.0000 mg | ORAL_TABLET | Freq: Every day | ORAL | 0 refills | Status: DC | PRN

## 2018-02-22 MED ORDER — FLUOXETINE HCL 40 MG PO CAPS: 40.0000 mg | ORAL_CAPSULE | Freq: Every day | ORAL | 0 refills | Status: DC

## 2018-02-22 NOTE — Patient Instructions (Addendum)
1. Increase fluoxetine 40 mg daily  2. Return to clinic in one month for 30 mins 3. Continue clonazepam 1 mg daily as needed for anxiety 4. Check TSH   CONTACT INFORMATION  What to do if you need to get in touch with someone regarding a psychiatric issue:  1. EMERGENCY: For psychiatric emergencies (if you are suicidal or if there are any other safety issues) call 911 and/or go to your nearest Emergency Room immediately.   2. IF YOU NEED SOMEONE TO TALK TO RIGHT NOW: Given my clinical responsibilities, I may not be able to speak with you over the phone for a prolonged period of time.  a. You may always call The National Suicide Prevention Lifeline at 1-800-273-TALK 336-665-7050(8255).  b. Your county of residence will also have local crisis services. [place]: [facility] at [phone] (24 Hour Crisis Hotline)

## 2018-03-01 ENCOUNTER — Encounter: Payer: Self-pay | Admitting: Family Medicine

## 2018-03-05 ENCOUNTER — Other Ambulatory Visit: Payer: Self-pay | Admitting: Family Medicine
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
## 2018-04-03 ENCOUNTER — Ambulatory Visit (HOSPITAL_COMMUNITY): Payer: Self-pay | Admitting: Psychiatry
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
## 2018-04-21 ENCOUNTER — Encounter (HOSPITAL_COMMUNITY): Payer: Self-pay | Admitting: Psychiatry

## 2018-04-21 ENCOUNTER — Ambulatory Visit (INDEPENDENT_AMBULATORY_CARE_PROVIDER_SITE_OTHER): Payer: Medicaid Other | Admitting: Psychiatry

## 2018-04-21 VITALS — BP 168/77 | HR 64 | Ht 65.0 in | Wt 182.0 lb

## 2018-04-21 DIAGNOSIS — F17210 Nicotine dependence, cigarettes, uncomplicated: Secondary | ICD-10-CM

## 2018-04-21 DIAGNOSIS — F419 Anxiety disorder, unspecified: Secondary | ICD-10-CM

## 2018-04-21 DIAGNOSIS — G4700 Insomnia, unspecified: Secondary | ICD-10-CM

## 2018-04-21 DIAGNOSIS — F4310 Post-traumatic stress disorder, unspecified: Secondary | ICD-10-CM

## 2018-04-21 DIAGNOSIS — F331 Major depressive disorder, recurrent, moderate: Secondary | ICD-10-CM

## 2018-04-21 MED ORDER — FLUOXETINE HCL 40 MG PO CAPS: 40.0000 mg | ORAL_CAPSULE | Freq: Every day | ORAL | 0 refills | Status: DC

## 2018-04-21 MED ORDER — CLONAZEPAM 1 MG PO TABS: 1.0000 mg | ORAL_TABLET | Freq: Every day | ORAL | 2 refills | Status: DC | PRN

## 2018-04-21 NOTE — Patient Instructions (Signed)
1. Increase fluoxetine 40 mg daily  2. Continue clonazepam 1 mg daily as needed for anxiety 3. Return to clinic in three months for 15 mins
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
## 2018-07-21 ENCOUNTER — Ambulatory Visit (INDEPENDENT_AMBULATORY_CARE_PROVIDER_SITE_OTHER): Payer: Medicaid Other | Admitting: Psychiatry

## 2018-07-21 ENCOUNTER — Encounter (HOSPITAL_COMMUNITY): Payer: Self-pay | Admitting: Psychiatry

## 2018-07-21 VITALS — BP 156/84 | HR 75 | Ht 64.75 in | Wt 180.0 lb

## 2018-07-21 DIAGNOSIS — F331 Major depressive disorder, recurrent, moderate: Secondary | ICD-10-CM

## 2018-07-21 MED ORDER — FLUOXETINE HCL 40 MG PO CAPS: ORAL_CAPSULE | ORAL | 0 refills | Status: DC

## 2018-07-21 MED ORDER — CLONAZEPAM 1 MG PO TABS: 1.0000 mg | ORAL_TABLET | Freq: Every day | ORAL | 1 refills | Status: DC | PRN

## 2018-07-21 MED ORDER — FLUOXETINE HCL 20 MG PO CAPS: ORAL_CAPSULE | ORAL | 0 refills | Status: DC

## 2018-07-21 NOTE — Patient Instructions (Signed)
1. Increase fluoxetine 60 mg daily  2. Continue clonazepam 1 mg daily as needed for anxiety 3.Return to clinic in three months for 15 mins

## 2018-07-22 ENCOUNTER — Other Ambulatory Visit: Payer: Self-pay | Admitting: Family Medicine

## 2018-10-04 ENCOUNTER — Other Ambulatory Visit (HOSPITAL_COMMUNITY)
Admission: RE | Admit: 2018-10-04 | Discharge: 2018-10-04 | Disposition: A | Discharge status: Home / Self Care | Payer: BLUE CROSS/BLUE SHIELD | Source: Ambulatory Visit | Attending: Physician Assistant | Admitting: Physician Assistant

## 2018-10-04 ENCOUNTER — Other Ambulatory Visit: Payer: Self-pay

## 2018-10-04 ENCOUNTER — Ambulatory Visit (INDEPENDENT_AMBULATORY_CARE_PROVIDER_SITE_OTHER): Payer: BLUE CROSS/BLUE SHIELD | Admitting: Physician Assistant

## 2018-10-04 ENCOUNTER — Encounter: Payer: Self-pay | Admitting: Physician Assistant

## 2018-10-04 VITALS — BP 130/88 | HR 71 | Temp 98.2°F | Resp 16 | Ht 65.0 in | Wt 178.0 lb

## 2018-10-04 DIAGNOSIS — J019 Acute sinusitis, unspecified: Secondary | ICD-10-CM

## 2018-10-04 DIAGNOSIS — B9689 Other specified bacterial agents as the cause of diseases classified elsewhere: Secondary | ICD-10-CM

## 2018-10-04 DIAGNOSIS — R300 Dysuria: Secondary | ICD-10-CM | POA: Diagnosis not present

## 2018-10-04 LAB — POCT URINALYSIS DIPSTICK
Bilirubin, UA: NEGATIVE
Blood, UA: NEGATIVE
Glucose, UA: NEGATIVE
Ketones, UA: NEGATIVE
Leukocytes, UA: NEGATIVE
Nitrite, UA: NEGATIVE
Protein, UA: NEGATIVE
Spec Grav, UA: 1.02 (ref 1.010–1.025)
Urobilinogen, UA: 0.2 E.U./dL
pH, UA: 5 (ref 5.0–8.0)

## 2018-10-04 MED ORDER — AMOXICILLIN-POT CLAVULANATE 875-125 MG PO TABS: 1.0000 | ORAL_TABLET | Freq: Two times a day (BID) | ORAL | 0 refills | Status: DC

## 2018-10-04 MED ORDER — BENZONATATE 100 MG PO CAPS: 100.0000 mg | ORAL_CAPSULE | Freq: Three times a day (TID) | ORAL | 0 refills | Status: DC | PRN

## 2018-10-04 NOTE — Patient Instructions (Signed)
Please take antibiotic as directed.  Increase fluid intake.  Use Saline nasal spray.  Take a daily multivitamin. Take the Tessalon as directed for cough. Continue Flonase and start an over-the-counter plain Mucinex.  Place a humidifier in the bedroom.  Please call or return clinic if symptoms are not improving.  Sinusitis Sinusitis is redness, soreness, and swelling (inflammation) of the paranasal sinuses. Paranasal sinuses are air pockets within the bones of your face (beneath the eyes, the middle of the forehead, or above the eyes). In healthy paranasal sinuses, mucus is able to drain out, and air is able to circulate through them by way of your nose. However, when your paranasal sinuses are inflamed, mucus and air can become trapped. This can allow bacteria and other germs to grow and cause infection. Sinusitis can develop quickly and last only a short time (acute) or continue over a long period (chronic). Sinusitis that lasts for more than 12 weeks is considered chronic.  CAUSES  Causes of sinusitis include:  Allergies.  Structural abnormalities, such as displacement of the cartilage that separates your nostrils (deviated septum), which can decrease the air flow through your nose and sinuses and affect sinus drainage.  Functional abnormalities, such as when the small hairs (cilia) that line your sinuses and help remove mucus do not work properly or are not present. SYMPTOMS  Symptoms of acute and chronic sinusitis are the same. The primary symptoms are pain and pressure around the affected sinuses. Other symptoms include:  Upper toothache.  Earache.  Headache.  Bad breath.  Decreased sense of smell and taste.  A cough, which worsens when you are lying flat.  Fatigue.  Fever.  Thick drainage from your nose, which often is green and may contain pus (purulent).  Swelling and warmth over the affected sinuses. DIAGNOSIS  Your caregiver will perform a physical exam. During the exam,  your caregiver may:  Look in your nose for signs of abnormal growths in your nostrils (nasal polyps).  Tap over the affected sinus to check for signs of infection.  View the inside of your sinuses (endoscopy) with a special imaging device with a light attached (endoscope), which is inserted into your sinuses. If your caregiver suspects that you have chronic sinusitis, one or more of the following tests may be recommended:  Allergy tests.  Nasal culture A sample of mucus is taken from your nose and sent to a lab and screened for bacteria.  Nasal cytology A sample of mucus is taken from your nose and examined by your caregiver to determine if your sinusitis is related to an allergy. TREATMENT  Most cases of acute sinusitis are related to a viral infection and will resolve on their own within 10 days. Sometimes medicines are prescribed to help relieve symptoms (pain medicine, decongestants, nasal steroid sprays, or saline sprays).  However, for sinusitis related to a bacterial infection, your caregiver will prescribe antibiotic medicines. These are medicines that will help kill the bacteria causing the infection.  Rarely, sinusitis is caused by a fungal infection. In theses cases, your caregiver will prescribe antifungal medicine. For some cases of chronic sinusitis, surgery is needed. Generally, these are cases in which sinusitis recurs more than 3 times per year, despite other treatments. HOME CARE INSTRUCTIONS   Drink plenty of water. Water helps thin the mucus so your sinuses can drain more easily.  Use a humidifier.  Inhale steam 3 to 4 times a day (for example, sit in the bathroom with the shower running).  Apply a warm, moist washcloth to your face 3 to 4 times a day, or as directed by your caregiver.  Use saline nasal sprays to help moisten and clean your sinuses.  Take over-the-counter or prescription medicines for pain, discomfort, or fever only as directed by your caregiver.  SEEK IMMEDIATE MEDICAL CARE IF:  You have increasing pain or severe headaches.  You have nausea, vomiting, or drowsiness.  You have swelling around your face.  You have vision problems.  You have a stiff neck.  You have difficulty breathing. MAKE SURE YOU:   Understand these instructions.  Will watch your condition.  Will get help right away if you are not doing well or get worse. Document Released: 07/12/2005 Document Revised: 10/04/2011 Document Reviewed: 07/27/2011 [facility] Patient Information 2014 [place], [place].

## 2018-10-04 NOTE — Progress Notes (Signed)
Patient presents to clinic today c/o 4 days of fatigue, sore throat, chills, aches, bilateral ear pressure/pain, low-grade fever initially but none since. Is now noting increased sinus pain, headache and maxillary pain. Notes some cough that is now productive of thick phlegm. Denies recent travel. Has been using Flonase for symptoms. Not taken anything else for symptoms. Patient also notes a several days of urinary hesitancy and dysuria with foul-smelling urine. Denies nausea or vomiting. Denies vaginal symptoms or concern for STI.   Past Medical History:  Diagnosis Date  . Anxiety   . Depression   . Hyperlipidemia   . Hypertension     Current Outpatient Medications on File Prior to Visit  Medication Sig Dispense Refill  . clonazePAM (KLONOPIN) 1 MG tablet TAKE ONE TABLET BY MOUTH TWO TIMES A DAY AS NEEDED FOR ANXIETY 30 tablet 0  . FLUoxetine (PROZAC) 20 MG capsule Take 60 mg (40 mg + 20 mg ) daily 90 capsule 0  . FLUoxetine (PROZAC) 40 MG capsule Take 60 mg (40 mg + 20 mg ) daily 90 capsule 0  . fluticasone (FLONASE) 50 MCG/ACT nasal spray Place 2 sprays into both nostrils daily. 16 g 6  . spironolactone (ALDACTONE) 25 MG tablet TAKE ONE TABLET BY MOUTH DAILY 30 tablet 4   No current facility-administered medications on file prior to visit.     Allergies  Allergen Reactions  . Codeine Nausea And Vomiting  . Pravastatin Other (See Comments)    Hair loss  . Albuterol Rash    Hives and itchy throat    Family History  Problem Relation Age of Onset  . Hypertension Mother   . Hypertension Maternal Grandmother     Social History   Socioeconomic History  . Marital status: Legally Separated    Spouse name: Not on file  . Number of children: Not on file  . Years of education: Not on file  . Highest education level: Not on file  Occupational History  . Not on file  Social Needs  . Financial resource strain: Not on file  . Food insecurity:    Worry: Not on file   Inability: Not on file  . Transportation needs:    Medical: Not on file    Non-medical: Not on file  Tobacco Use  . Smoking status: Current Every Day Smoker    Packs/day: 1.00    Years: 30.00    Pack years: 30.00    Types: Cigarettes  . Smokeless tobacco: Never Used  Substance and Sexual Activity  . Alcohol use: Yes    Alcohol/week: 1.0 standard drinks    Types: 1 Glasses of wine per week    Comment: 2x week; 1-2 glasses of wine  . Drug use: No  . Sexual activity: Yes    Partners: Male    Birth control/protection: None  Lifestyle  . Physical activity:    Days per week: Not on file    Minutes per session: Not on file  . Stress: Not on file  Relationships  . Social connections:    Talks on phone: Not on file    Gets together: Not on file    Attends religious service: Not on file    Active member of club or organization: Not on file    Attends meetings of clubs or organizations: Not on file    Relationship status: Not on file  Other Topics Concern  . Not on file  Social History Narrative  . Not on file  Review of Systems - See HPI.  All other ROS are negative.  BP 130/88   Pulse 71   Temp 98.2 F (36.8 C) (Oral)   Resp 16   Ht 5\' 5"  (1.651 m)   Wt 178 lb (80.7 kg)   SpO2 98%   BMI 29.62 kg/m   Physical Exam Vitals signs reviewed.  Constitutional:      Appearance: Normal appearance.  HENT:     Head: Normocephalic and atraumatic.     Right Ear: Tympanic membrane normal.     Left Ear: Tympanic membrane normal.     Nose: Congestion and rhinorrhea present.     Right Turbinates: Enlarged and swollen.     Left Turbinates: Enlarged and swollen.     Right Sinus: Maxillary sinus tenderness and frontal sinus tenderness present.     Left Sinus: Frontal sinus tenderness present.     Mouth/Throat:     Mouth: Mucous membranes are moist.  Eyes:     Conjunctiva/sclera: Conjunctivae normal.  Neck:     Musculoskeletal: Neck supple.  Cardiovascular:     Rate and  Rhythm: Normal rate and regular rhythm.     Pulses: Normal pulses.     Heart sounds: Normal heart sounds.  Pulmonary:     Effort: Pulmonary effort is normal.     Breath sounds: Normal breath sounds.  Neurological:     Mental Status: She is alert.    Assessment/Plan: 1. Acute bacterial sinusitis Rx Augmentin.  Increase fluids.  Rest.  Saline nasal spray.  Probiotic.  Mucinex as directed.  Humidifier in bedroom.  Call or return to clinic if symptoms are not improving. Tessalon per orders.  - amoxicillin-clavulanate (AUGMENTIN) 875-125 MG tablet; Take 1 tablet by mouth 2 (two) times daily.  Dispense: 14 tablet; Refill: 0 - benzonatate (TESSALON) 100 MG capsule; Take 1 capsule (100 mg total) by mouth 3 (three) times daily as needed for cough.  Dispense: 30 capsule; Refill: 0  2. Dysuria Urine dip negative. Will check ancillary studies for BV/yeast. Will treat based on findings. - POCT urinalysis dipstick - Urine cytology ancillary only([place])   Piedad Climes, PA-C

## 2018-10-06 LAB — URINE CYTOLOGY ANCILLARY ONLY
Bacterial vaginitis: NEGATIVE
Candida vaginitis: NEGATIVE
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
## 2018-10-17 ENCOUNTER — Other Ambulatory Visit (HOSPITAL_COMMUNITY): Payer: Self-pay | Admitting: Psychiatry

## 2018-10-17 MED ORDER — FLUOXETINE HCL 20 MG PO CAPS: ORAL_CAPSULE | ORAL | 0 refills | Status: DC

## 2018-10-17 MED ORDER — FLUOXETINE HCL 40 MG PO CAPS: ORAL_CAPSULE | ORAL | 0 refills | Status: DC

## 2018-10-20 ENCOUNTER — Other Ambulatory Visit: Payer: Self-pay

## 2018-10-20 ENCOUNTER — Encounter (HOSPITAL_COMMUNITY): Payer: Self-pay | Admitting: Psychiatry

## 2018-10-20 ENCOUNTER — Ambulatory Visit (INDEPENDENT_AMBULATORY_CARE_PROVIDER_SITE_OTHER): Payer: Medicaid Other | Admitting: Psychiatry

## 2018-10-20 DIAGNOSIS — F4310 Post-traumatic stress disorder, unspecified: Secondary | ICD-10-CM

## 2018-10-20 DIAGNOSIS — F330 Major depressive disorder, recurrent, mild: Secondary | ICD-10-CM

## 2018-10-20 DIAGNOSIS — G4700 Insomnia, unspecified: Secondary | ICD-10-CM

## 2018-10-20 MED ORDER — TRAZODONE HCL 50 MG PO TABS: ORAL_TABLET | ORAL | 0 refills | Status: DC

## 2018-10-20 MED ORDER — CLONAZEPAM 1 MG PO TABS: 1.0000 mg | ORAL_TABLET | Freq: Every day | ORAL | 1 refills | Status: DC | PRN

## 2018-10-20 NOTE — Patient Instructions (Signed)
1. Continue fluoxetine 60 mg daily  2. Start Trazodone 25 mg at night as needed for sleep 2. Continue clonazepam 1 mg daily as needed for anxiety 3.Return to clinic inthree monthsfor 

## 2018-11-14 ENCOUNTER — Other Ambulatory Visit: Payer: Self-pay | Admitting: Family Medicine

## 2018-11-14 NOTE — Telephone Encounter (Signed)
Has patient switched PCP's?  I see that she was recently seen at [facility] primary care and PCP was changed to that provider.  Also, patient is overdue for renal function panel.  She will likely need face to face visit for labs, etc.  Please inquire with regards to where she is receiving care now and have her schedule an appointment accordingly so she can renew her medications.

## 2018-11-14 NOTE — Telephone Encounter (Signed)
Please call [facility] concerning refill requests.   If you have changed providers, we need to know. PCP listed does not work here.

## 2018-11-19 ENCOUNTER — Other Ambulatory Visit: Payer: Self-pay | Admitting: Family Medicine

## 2018-11-20 ENCOUNTER — Other Ambulatory Visit: Payer: Self-pay | Admitting: Family Medicine

## 2018-11-22 ENCOUNTER — Encounter: Payer: Self-pay | Admitting: Emergency Medicine

## 2018-11-22 NOTE — Telephone Encounter (Signed)
LMOVM advising patient to call to schedule a video visit for Blood pressure. My chart message sent to patient as well.

## 2018-12-21 ENCOUNTER — Other Ambulatory Visit: Payer: Self-pay | Admitting: Family Medicine

## 2019-01-02 ENCOUNTER — Other Ambulatory Visit (HOSPITAL_COMMUNITY): Payer: Self-pay | Admitting: Psychiatry

## 2019-01-02 MED ORDER — FLUOXETINE HCL 40 MG PO CAPS: ORAL_CAPSULE | ORAL | 0 refills | Status: DC

## 2019-01-02 MED ORDER — FLUOXETINE HCL 20 MG PO CAPS: ORAL_CAPSULE | ORAL | 0 refills | Status: DC

## 2019-01-12 ENCOUNTER — Other Ambulatory Visit (HOSPITAL_COMMUNITY): Payer: Self-pay | Admitting: Psychiatry

## 2019-01-15 ENCOUNTER — Other Ambulatory Visit (HOSPITAL_COMMUNITY): Payer: Self-pay | Admitting: Psychiatry

## 2019-01-15 MED ORDER — TRAZODONE HCL 50 MG PO TABS: ORAL_TABLET | ORAL | 0 refills | Status: DC
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
## 2019-01-17 ENCOUNTER — Other Ambulatory Visit: Payer: Self-pay | Admitting: Physician Assistant

## 2019-01-18 ENCOUNTER — Encounter: Payer: Self-pay | Admitting: Physician Assistant

## 2019-01-18 NOTE — Telephone Encounter (Signed)
Please advise on the refill    Copied from [place] [id number]. Topic: General - Other >> Jan 17, 2019  4:58 PM Keene Breath wrote: Reason for CRM: Patient called after hours to request a refill for her spironolactone (ALDACTONE) 25 MG tablet (Expired).  She stated that she is all out of this medication.  Please call patient tomorrow as soon as possible.  CB# [phone]

## 2019-01-23 ENCOUNTER — Encounter (HOSPITAL_COMMUNITY): Payer: Self-pay | Admitting: Psychiatry

## 2019-01-23 ENCOUNTER — Ambulatory Visit (INDEPENDENT_AMBULATORY_CARE_PROVIDER_SITE_OTHER): Payer: Medicaid Other | Admitting: Psychiatry

## 2019-01-23 ENCOUNTER — Other Ambulatory Visit: Payer: Self-pay

## 2019-01-23 DIAGNOSIS — F331 Major depressive disorder, recurrent, moderate: Secondary | ICD-10-CM | POA: Diagnosis not present

## 2019-01-23 DIAGNOSIS — F4310 Post-traumatic stress disorder, unspecified: Secondary | ICD-10-CM | POA: Diagnosis not present

## 2019-01-23 MED ORDER — CLONAZEPAM 1 MG PO TABS: 1.0000 mg | ORAL_TABLET | Freq: Every day | ORAL | 1 refills | Status: DC | PRN

## 2019-01-23 NOTE — Patient Instructions (Addendum)
1. Continue fluoxetine 60 mg daily  2. Hold trazodone 3.Continue clonazepam 1 mg daily as needed for anxiety 4.Next appointment: 8/27 at 9 AM  5. Contact our office if you are interested in therapy referral

## 2019-02-13 ENCOUNTER — Other Ambulatory Visit: Payer: Self-pay | Admitting: Physician Assistant

## 2019-02-14 ENCOUNTER — Encounter: Payer: Self-pay | Admitting: Physician Assistant

## 2019-02-16 ENCOUNTER — Other Ambulatory Visit: Payer: Self-pay

## 2019-02-16 ENCOUNTER — Encounter: Payer: Self-pay | Admitting: Physician Assistant

## 2019-02-16 ENCOUNTER — Ambulatory Visit (INDEPENDENT_AMBULATORY_CARE_PROVIDER_SITE_OTHER): Payer: Medicaid Other | Admitting: Physician Assistant

## 2019-02-16 DIAGNOSIS — J019 Acute sinusitis, unspecified: Secondary | ICD-10-CM

## 2019-02-16 DIAGNOSIS — B9689 Other specified bacterial agents as the cause of diseases classified elsewhere: Secondary | ICD-10-CM | POA: Diagnosis not present

## 2019-02-16 DIAGNOSIS — I10 Essential (primary) hypertension: Secondary | ICD-10-CM | POA: Diagnosis not present

## 2019-02-16 MED ORDER — FLUTICASONE PROPIONATE 50 MCG/ACT NA SUSP: 2.0000 | Freq: Every day | NASAL | 6 refills | Status: DC

## 2019-02-16 MED ORDER — SPIRONOLACTONE 25 MG PO TABS: ORAL_TABLET | ORAL | 1 refills | Status: DC

## 2019-02-16 MED ORDER — DOXYCYCLINE HYCLATE 100 MG PO CAPS: 100.0000 mg | ORAL_CAPSULE | Freq: Two times a day (BID) | ORAL | 0 refills | Status: DC

## 2019-02-16 NOTE — Progress Notes (Signed)
Virtual Visit via Video   I connected with patient on 02/16/19 at  9:30 AM EDT by a video enabled telemedicine application and verified that I am speaking with the correct person using two identifiers.  Location patient: [place] [facility] [place] Persons participating in the virtual visit: Patient, Provider, CMA (Patina Moore)  I discussed the limitations of evaluation and management by telemedicine and the availability of in person appointments. The patient expressed understanding and agreed to proceed.  Subjective:   HPI:   Patient presents today for follow-up of hypertension as she is overdue for follow-up. Patient is currently on a regimen of spironolactone 25 mg daily. Endorses taking as directed. Notes BP has been good on this regimen. Has been out of medication for 3 days or so and noting increase in BP to 160s-170s. Has not checked today. Patient denies chest pain, palpitations, lightheadedness, dizziness, vision changes or frequent headaches.  Patient endorses a few weeks of sinus pressure, sinus pain, headache and fatigue. Notes dry cough. Denies chest pain, SOB or chest congestion. Notes significant nose congestion. Is not using her Flonase as she is out. Has been taking Ibuprofen for symptoms. Denies recent travel or sick contact. Denies concern for COVID. Denies change to taste/smell.  ROS:   See pertinent positives and negatives per HPI.  Patient Active Problem List   Diagnosis Date Noted  . Abnormal cervical Papanicolaou smear 12/27/2017  . HTN (hypertension) 09/17/2015  . Tobacco use disorder 09/16/2015    Social History   Tobacco Use  . Smoking status: Current Every Day Smoker    Packs/day: 1.00    Years: 30.00    Pack years: 30.00    Types: Cigarettes  . Smokeless tobacco: Never Used  Substance Use Topics  . Alcohol use: Yes    Alcohol/week: 1.0 standard drinks    Types: 1 Glasses of wine per week    Comment: 2x week; 1-2  glasses of wine    Current Outpatient Medications:  .  clonazePAM (KLONOPIN) 1 MG tablet, Take 1 tablet (1 mg total) by mouth daily as needed for anxiety., Disp: 30 tablet, Rfl: 1 .  FLUoxetine (PROZAC) 20 MG capsule, Take 60 mg (40 mg + 20 mg ) daily, Disp: 90 capsule, Rfl: 0 .  FLUoxetine (PROZAC) 40 MG capsule, Take 60 mg (40 mg + 20 mg ) daily, Disp: 90 capsule, Rfl: 0 .  fluticasone (FLONASE) 50 MCG/ACT nasal spray, Place 2 sprays into both nostrils daily., Disp: 16 g, Rfl: 6 .  spironolactone (ALDACTONE) 25 MG tablet, Take 1 tablet PO daily, Disp: 30 tablet, Rfl: 1 .  doxycycline (VIBRAMYCIN) 100 MG capsule, Take 1 capsule (100 mg total) by mouth 2 (two) times daily., Disp: 20 capsule, Rfl: 0  Allergies  Allergen Reactions  . Codeine Nausea And Vomiting  . Pravastatin Other (See Comments)    Hair loss  . Albuterol Rash    Hives and itchy throat    Objective:   There were no vitals taken for this visit.  Patient is well-developed, well-nourished in no acute distress.  Resting comfortably at home.  Head is normocephalic, atraumatic.  No labored breathing.  Speech is clear and coherent with logical contest.  Patient is alert and oriented at baseline.   Assessment and Plan:   1. Essential hypertension Medication restarted. She is take daily as directed. Follow DASH diet. Follow-up in 2 weeks in office for repeat labs and BP assessment. She is calling back to schedule. She  has been encouraged to schedule CPE as this is overdue.  - spironolactone (ALDACTONE) 25 MG tablet; Take 1 tablet PO daily  Dispense: 30 tablet; Refill: 1  2. Acute bacterial sinusitis Rx Doxycycline.  Increase fluids.  Rest.  Saline nasal spray.  Probiotic.  Mucinex as directed.  Humidifier in bedroom. Flonase refilled.  Call or return to clinic if symptoms are not improving.  - doxycycline (VIBRAMYCIN) 100 MG capsule; Take 1 capsule (100 mg total) by mouth 2 (two) times daily.  Dispense: 20 capsule;  Refill: 0 - fluticasone (FLONASE) 50 MCG/ACT nasal spray; Place 2 sprays into both nostrils daily.  Dispense: 16 g; Refill: 6 .   Leeanne Rio, PA-C 02/16/2019

## 2019-02-16 NOTE — Progress Notes (Signed)
I have discussed the procedure for the virtual visit with the patient who has given consent to proceed with assessment and treatment.   Linsey Hirota S Jatavia Keltner, CMA     

## 2019-02-16 NOTE — Patient Instructions (Signed)
Instructions sent to MyChart.   Restart your BP medication and take daily as directed. Try to keep a low salt diet.  Call office to schedule follow-up in 2 weeks (lab visit) for recheck of kidney function and electrolytes along with a BP check.  Please take antibiotic as directed.  Increase fluid intake.  Use Saline nasal spray.  Take a daily multivitamin. Restart Flonase.  Place a humidifier in the bedroom.  Please call or return clinic if symptoms are not improving.  Sinusitis Sinusitis is redness, soreness, and swelling (inflammation) of the paranasal sinuses. Paranasal sinuses are air pockets within the bones of your face (beneath the eyes, the middle of the forehead, or above the eyes). In healthy paranasal sinuses, mucus is able to drain out, and air is able to circulate through them by way of your nose. However, when your paranasal sinuses are inflamed, mucus and air can become trapped. This can allow bacteria and other germs to grow and cause infection. Sinusitis can develop quickly and last only a short time (acute) or continue over a long period (chronic). Sinusitis that lasts for more than 12 weeks is considered chronic.  CAUSES  Causes of sinusitis include:  Allergies.  Structural abnormalities, such as displacement of the cartilage that separates your nostrils (deviated septum), which can decrease the air flow through your nose and sinuses and affect sinus drainage.  Functional abnormalities, such as when the small hairs (cilia) that line your sinuses and help remove mucus do not work properly or are not present. SYMPTOMS  Symptoms of acute and chronic sinusitis are the same. The primary symptoms are pain and pressure around the affected sinuses. Other symptoms include:  Upper toothache.  Earache.  Headache.  Bad breath.  Decreased sense of smell and taste.  A cough, which worsens when you are lying flat.  Fatigue.  Fever.  Thick drainage from your nose, which often  is green and may contain pus (purulent).  Swelling and warmth over the affected sinuses. DIAGNOSIS  Your caregiver will perform a physical exam. During the exam, your caregiver may:  Look in your nose for signs of abnormal growths in your nostrils (nasal polyps).  Tap over the affected sinus to check for signs of infection.  View the inside of your sinuses (endoscopy) with a special imaging device with a light attached (endoscope), which is inserted into your sinuses. If your caregiver suspects that you have chronic sinusitis, one or more of the following tests may be recommended:  Allergy tests.  Nasal culture A sample of mucus is taken from your nose and sent to a lab and screened for bacteria.  Nasal cytology A sample of mucus is taken from your nose and examined by your caregiver to determine if your sinusitis is related to an allergy. TREATMENT  Most cases of acute sinusitis are related to a viral infection and will resolve on their own within 10 days. Sometimes medicines are prescribed to help relieve symptoms (pain medicine, decongestants, nasal steroid sprays, or saline sprays).  However, for sinusitis related to a bacterial infection, your caregiver will prescribe antibiotic medicines. These are medicines that will help kill the bacteria causing the infection.  Rarely, sinusitis is caused by a fungal infection. In theses cases, your caregiver will prescribe antifungal medicine. For some cases of chronic sinusitis, surgery is needed. Generally, these are cases in which sinusitis recurs more than 3 times per year, despite other treatments. HOME CARE INSTRUCTIONS   Drink plenty of water. Water helps  thin the mucus so your sinuses can drain more easily.  Use a humidifier.  Inhale steam 3 to 4 times a day (for example, sit in the bathroom with the shower running).  Apply a warm, moist washcloth to your face 3 to 4 times a day, or as directed by your caregiver.  Use saline nasal  sprays to help moisten and clean your sinuses.  Take over-the-counter or prescription medicines for pain, discomfort, or fever only as directed by your caregiver. SEEK IMMEDIATE MEDICAL CARE IF:  You have increasing pain or severe headaches.  You have nausea, vomiting, or drowsiness.  You have swelling around your face.  You have vision problems.  You have a stiff neck.  You have difficulty breathing. MAKE SURE YOU:   Understand these instructions.  Will watch your condition.  Will get help right away if you are not doing well or get worse. Document Released: 07/12/2005 Document Revised: 10/04/2011 Document Reviewed: 07/27/2011 [facility] Patient Information 2014 [place], [place].
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
## 2019-03-18 ENCOUNTER — Emergency Department (HOSPITAL_COMMUNITY): Payer: Medicaid Other

## 2019-03-18 ENCOUNTER — Emergency Department (HOSPITAL_COMMUNITY)
Admission: EM | Admit: 2019-03-18 | Discharge: 2019-03-18 | Disposition: A | Discharge status: Home / Self Care | Payer: Medicaid Other | Attending: Emergency Medicine | Admitting: Emergency Medicine

## 2019-03-18 ENCOUNTER — Encounter (HOSPITAL_COMMUNITY): Payer: Self-pay

## 2019-03-18 ENCOUNTER — Other Ambulatory Visit: Payer: Self-pay

## 2019-03-18 DIAGNOSIS — I10 Essential (primary) hypertension: Secondary | ICD-10-CM | POA: Insufficient documentation

## 2019-03-18 DIAGNOSIS — R0602 Shortness of breath: Secondary | ICD-10-CM | POA: Diagnosis not present

## 2019-03-18 DIAGNOSIS — Z20828 Contact with and (suspected) exposure to other viral communicable diseases: Secondary | ICD-10-CM | POA: Insufficient documentation

## 2019-03-18 DIAGNOSIS — M791 Myalgia, unspecified site: Secondary | ICD-10-CM | POA: Diagnosis not present

## 2019-03-18 DIAGNOSIS — F17210 Nicotine dependence, cigarettes, uncomplicated: Secondary | ICD-10-CM | POA: Diagnosis not present

## 2019-03-18 DIAGNOSIS — R05 Cough: Secondary | ICD-10-CM | POA: Diagnosis present

## 2019-03-18 DIAGNOSIS — Z79899 Other long term (current) drug therapy: Secondary | ICD-10-CM | POA: Diagnosis not present

## 2019-03-18 DIAGNOSIS — Z20822 Contact with and (suspected) exposure to covid-19: Secondary | ICD-10-CM

## 2019-03-18 DIAGNOSIS — R059 Cough, unspecified: Secondary | ICD-10-CM

## 2019-03-18 LAB — GROUP A STREP BY PCR: Group A Strep by PCR: NOT DETECTED

## 2019-03-18 MED ORDER — DEXAMETHASONE 6 MG PO TABS: 6.0000 mg | ORAL_TABLET | Freq: Every day | ORAL | 0 refills | Status: DC

## 2019-03-18 MED ORDER — DEXAMETHASONE SODIUM PHOSPHATE 10 MG/ML IJ SOLN
6.0000 mg | Freq: Once | INTRAMUSCULAR | Status: AC
  Administered 2019-03-18: 6 mg via INTRAVENOUS
  Filled 2019-03-18: qty 1

## 2019-03-18 MED ORDER — SODIUM CHLORIDE 0.9 % IV BOLUS
1000.0000 mL | Freq: Once | INTRAVENOUS | Status: AC
  Administered 2019-03-18: 15:00:00 1000 mL via INTRAVENOUS

## 2019-03-18 NOTE — Discharge Instructions (Addendum)
You have been tested for COVID-19 virus, your symptoms suggest you very likely have this virus.  Please continue to quarantine at home and monitor your symptoms closely. You chest x-ray was clear. Antibiotics are not helpful in treating viral infection, the virus should run its course in about 10-14 days. Please make sure you are drinking plenty of fluids. You can treat your symptoms supportively with tylenol 1000 mg  every 6 hours for fevers and pains, and over the counter cough syrups and throat lozenges to help with cough. If your symptoms are not improving please follow up with you Primary doctor.   I recommend that you purchase a home pulse ox to help better monitor your oxygen at home, if you start to have increased work of breathing or shortness of breath or your oxygen drops below 90% please immediately return to the hospital for reevaluation.  If you develop persistent fevers, shortness of breath or difficulty breathing, chest pain, severe headache and neck pain, persistent nausea and vomiting or other new or concerning symptoms return to the Emergency department.

## 2019-03-18 NOTE — ED Provider Notes (Signed)
[facility] Provider Note   CSN: [id number] Arrival date & time: 03/18/19  1349     History   Chief Complaint Chief Complaint  Patient presents with  . Fever  . Shortness of Breath  . Cough    HPI Carla Torres is a 53 y.o. female.     Carla Torres is a 53 y.o. female with history of hypertension, hyperlipidemia and tobacco abuse, who presents the emergency department via EMS for evaluation of fever, cough, body aches and shortness of breath.  Patient reports symptoms started yesterday afternoon when she developed fever, chills and sore throat.  She reports despite the symptoms she went out to dinner with some friends last night.  When she woke up this morning she had diffuse myalgias, reports that her neck felt stiff and she started having a productive cough, she reports it feels like chest is congested and she reports shortness of breath.  She denies any associated chest pain.  She reports that when EMS arrived she had a temp of 100.6.  She was given Tylenol in route, prior to this had not taken any medications for her symptoms.  Reports she has not smoked today because of her shortness of breath.  Patient reports an associated mild frontal headache.  No rhinorrhea.  She denies any abdominal pain, nausea vomiting or diarrhea.  Patient is unsure of any sick contacts.     Past Medical History:  Diagnosis Date  . Anxiety   . Depression   . Hyperlipidemia   . Hypertension     Patient Active Problem List   Diagnosis Date Noted  . Abnormal cervical Papanicolaou smear 12/27/2017  . HTN (hypertension) 09/17/2015  . Tobacco use disorder 09/16/2015    Past Surgical History:  Procedure Laterality Date  . APPENDECTOMY    . CHOLECYSTECTOMY    . COLON RESECTION    . COLON SURGERY  1995  . EXPLORATION POST OPERATIVE OPEN HEART  1969  . MASTECTOMY    . PATENT DUCTUS ARTERIOUS REPAIR       OB History   No obstetric history on file.      Home Medications    Prior to Admission medications   Medication Sig Start Date End Date Taking? Authorizing Provider  clonazePAM (KLONOPIN) 1 MG tablet Take 1 tablet (1 mg total) by mouth daily as needed for anxiety. 01/23/19   Neysa HotterHisada, Reina, MD  dexamethasone (DECADRON) 6 MG tablet Take 1 tablet (6 mg total) by mouth daily. 03/18/19   Dartha LodgeFord, Myrtie Leuthold N, PA-C  doxycycline (VIBRAMYCIN) 100 MG capsule Take 1 capsule (100 mg total) by mouth 2 (two) times daily. 02/16/19   Waldon MerlMartin, William C, PA-C  FLUoxetine (PROZAC) 20 MG capsule Take 60 mg (40 mg + 20 mg ) daily 01/17/19   Neysa HotterHisada, Reina, MD  FLUoxetine (PROZAC) 40 MG capsule Take 60 mg (40 mg + 20 mg ) daily 01/17/19   Neysa HotterHisada, Reina, MD  fluticasone (FLONASE) 50 MCG/ACT nasal spray Place 2 sprays into both nostrils daily. 02/16/19   Waldon MerlMartin, William C, PA-C  spironolactone (ALDACTONE) 25 MG tablet Take 1 tablet PO daily 02/16/19   Waldon MerlMartin, William C, PA-C    Family History Family History  Problem Relation Age of Onset  . Hypertension Mother   . Hypertension Maternal Grandmother     Social History Social History   Tobacco Use  . Smoking status: Current Every Day Smoker    Packs/day: 1.00    Years: 30.00  Pack years: 30.00    Types: Cigarettes  . Smokeless tobacco: Never Used  Substance Use Topics  . Alcohol use: Yes    Alcohol/week: 1.0 standard drinks    Types: 1 Glasses of wine per week    Comment: 2x week; 1-2 glasses of wine  . Drug use: No     Allergies   Codeine, Pravastatin, and Albuterol   Review of Systems Review of Systems  Constitutional: Negative for chills and fever.  HENT: Positive for sore throat. Negative for congestion, rhinorrhea and trouble swallowing.   Respiratory: Positive for cough and shortness of breath.   Cardiovascular: Negative for chest pain.  Gastrointestinal: Negative for abdominal pain, constipation, diarrhea, nausea and vomiting.  Genitourinary: Negative for dysuria and frequency.   Musculoskeletal: Positive for back pain, myalgias and neck pain.  Skin: Negative for color change and rash.  Neurological: Positive for headaches. Negative for dizziness, syncope, weakness, light-headedness and numbness.     Physical Exam Updated Vital Signs BP 130/80   Pulse 68   Temp 99.2 F (37.3 C) (Oral)   Resp (!) 22   Ht 5\' 5"  (1.651 m)   Wt 74.8 kg   SpO2 95%   BMI 27.46 kg/m   Physical Exam Vitals signs and nursing note reviewed.  Constitutional:      General: She is not in acute distress.    Appearance: She is well-developed. She is obese. She is ill-appearing. She is not diaphoretic.     Comments: Patient is somewhat ill-appearing, but in no acute distress.  HENT:     Head: Normocephalic and atraumatic.     Right Ear: Tympanic membrane and ear canal normal.     Left Ear: Tympanic membrane and ear canal normal.     Mouth/Throat:     Comments: Posterior oropharynx clear and mucous membranes moist, there is mild erythema but no edema or tonsillar exudates, uvula midline, normal phonation, no trismus, tolerating secretions without difficulty. Eyes:     General:        Right eye: No discharge.        Left eye: No discharge.     Pupils: Pupils are equal, round, and reactive to light.  Neck:     Musculoskeletal: Neck supple. No neck rigidity.     Meningeal: Brudzinski's sign and Kernig's sign absent.  Cardiovascular:     Rate and Rhythm: Normal rate and regular rhythm.     Heart sounds: Normal heart sounds. No murmur. No friction rub. No gallop.   Pulmonary:     Effort: Pulmonary effort is normal. No respiratory distress.     Breath sounds: Normal breath sounds. No wheezing or rales.     Comments: Respirations equal and unlabored, patient able to speak in full sentences, lungs clear to auscultation bilaterally Abdominal:     General: Bowel sounds are normal. There is no distension.     Palpations: Abdomen is soft. There is no mass.     Tenderness: There is no  abdominal tenderness. There is no guarding.     Comments: Abdomen soft, nondistended, nontender to palpation in all quadrants without guarding or peritoneal signs  Musculoskeletal:        General: No deformity.  Skin:    General: Skin is warm and dry.     Capillary Refill: Capillary refill takes less than 2 seconds.  Neurological:     Mental Status: She is alert.     Coordination: Coordination normal.     Comments: Speech is  clear, able to follow commands CN III-XII intact Normal strength in upper and lower extremities bilaterally including dorsiflexion and plantar flexion, strong and equal grip strength Sensation normal to light and sharp touch Moves extremities without ataxia, coordination intact   Psychiatric:        Mood and Affect: Mood normal.        Behavior: Behavior normal.      ED Treatments / Results  Labs (all labs ordered are listed, but only abnormal results are displayed) Labs Reviewed  GROUP A STREP BY PCR  NOVEL CORONAVIRUS, NAA (HOSPITAL ORDER, SEND-OUT TO REF LAB)    EKG None  Radiology Dg Chest Portable 1 View  Result Date: 03/18/2019 CLINICAL DATA:  fever, cough, muscle aches and SOB on exertion starting 1500 03/17/2019. Pt reports she went out last night afterwards and felt fine. Woke up at 0730 with a stiff neck. EXAM: PORTABLE CHEST 1 VIEW COMPARISON:  12/25/2007 FINDINGS: Prominent LEFT ventricular contour. The lungs are clear. No pulmonary edema or consolidation. IMPRESSION: No active disease. Electronically Signed   By: Norva PavlovElizabeth  Brown M.D.   On: 03/18/2019 15:22    Procedures Procedures (including critical care time)  Medications Ordered in ED Medications  sodium chloride 0.9 % bolus 1,000 mL (0 mLs Intravenous Stopped 03/18/19 1555)  dexamethasone (DECADRON) injection 6 mg (6 mg Intravenous Given 03/18/19 1503)     Initial Impression / Assessment and Plan / ED Course  I have reviewed the triage vital signs and the nursing notes.   Pertinent labs & imaging results that were available during my care of the patient were reviewed by me and considered in my medical decision making (see chart for details).  53 year old female presents with fevers, cough, shortness of breath and muscle aches, symptoms started yesterday.  Reports associated sore throat.  Throat is erythematous without exudates or tonsillar edema, strep is negative.  Patient with low-grade fever, but normal O2 sats, no tachypnea.  Lungs clear to auscultation throughout.  Strong suspicion for COVID-19 infection as cause for patient's symptoms.  She reports myalgias throughout and complains of stiff neck, she does not have any nuchal rigidity or meningeal signs, normal neurologic exam.  COVID-19 test ordered, chest x-ray without infiltrates.  Patient given fluids, she is allergic to albuterol, will start patient on Decadron to help her of inflammation and improve symptoms.  Discussed with patient taking regular scheduled Tylenol, ensuring that she drinks plenty of fluids, get rest, and takes Decadron daily for the next 10 days.  Strict return precautions discussed, and encourage patient to purchase home pulse ox monitor for worsening in her symptoms.  She expresses understanding and agreement with plan.  Discharged home in good condition.  Carla BatheJulia M Sidener was evaluated in Emergency Department on 03/18/2019 for the symptoms described in the history of present illness. She was evaluated in the context of the global COVID-19 pandemic, which necessitated consideration that the patient might be at risk for infection with the SARS-CoV-2 virus that causes COVID-19. Institutional protocols and algorithms that pertain to the evaluation of patients at risk for COVID-19 are in a state of rapid change based on information released by regulatory bodies including the CDC and federal and state organizations. These policies and algorithms were followed during the patient's care in the ED.   Final  Clinical Impressions(s) / ED Diagnoses   Final diagnoses:  Suspected Covid-19 Virus Infection  Cough  Myalgia    ED Discharge Orders  Ordered    dexamethasone (DECADRON) 6 MG tablet  Daily     03/18/19 1616           Jacqlyn Larsen, [place] 03/18/19 1711    Hayden Rasmussen, MD 03/19/19 [phone]

## 2019-03-18 NOTE — ED Notes (Signed)
Pt dc'd home with all belongings, a/o x4, verbalizes understanding of dc instructions.

## 2019-03-18 NOTE — ED Triage Notes (Signed)
Per [facility] EMS pt reports fever, cough, muscle aches and SOB on exertion starting 1500 03/17/2019. Pt reports she went out last night afterwards and felt fine. Woke up at 0730 with a stiff neck. Acetaminophen 1000 mg given en route

## 2019-03-19 LAB — NOVEL CORONAVIRUS, NAA (HOSP ORDER, SEND-OUT TO REF LAB; TAT 18-24 HRS): SARS-CoV-2, NAA: NOT DETECTED

## 2019-03-20 ENCOUNTER — Encounter: Payer: Self-pay | Admitting: Physician Assistant

## 2019-03-22 ENCOUNTER — Encounter (HOSPITAL_COMMUNITY): Payer: Self-pay | Admitting: Psychiatry

## 2019-03-22 ENCOUNTER — Other Ambulatory Visit: Payer: Self-pay

## 2019-03-22 ENCOUNTER — Ambulatory Visit (INDEPENDENT_AMBULATORY_CARE_PROVIDER_SITE_OTHER): Payer: Medicaid Other | Admitting: Psychiatry

## 2019-03-22 DIAGNOSIS — F4310 Post-traumatic stress disorder, unspecified: Secondary | ICD-10-CM

## 2019-03-22 DIAGNOSIS — F331 Major depressive disorder, recurrent, moderate: Secondary | ICD-10-CM

## 2019-03-22 MED ORDER — FLUOXETINE HCL 20 MG PO CAPS: ORAL_CAPSULE | ORAL | 0 refills | Status: DC

## 2019-03-22 MED ORDER — FLUOXETINE HCL 40 MG PO CAPS: ORAL_CAPSULE | ORAL | 0 refills | Status: DC

## 2019-03-22 NOTE — Patient Instructions (Signed)
1.Continuefluoxetine 60 mg daily  2. Continue clonazepam 1 mg daily as needed for anxiety  3.Next appointment: 11/5 at 9:20

## 2019-05-13 ENCOUNTER — Other Ambulatory Visit: Payer: Self-pay | Admitting: Physician Assistant

## 2019-05-13 DIAGNOSIS — I10 Essential (primary) hypertension: Secondary | ICD-10-CM

## 2019-05-14 ENCOUNTER — Encounter: Payer: Self-pay | Admitting: Emergency Medicine

## 2019-05-14 NOTE — Telephone Encounter (Signed)
Patient overdue for repeat HTN
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
## 2019-05-28 ENCOUNTER — Other Ambulatory Visit: Payer: Self-pay | Admitting: Physician Assistant

## 2019-05-28 DIAGNOSIS — I10 Essential (primary) hypertension: Secondary | ICD-10-CM

## 2019-05-31 ENCOUNTER — Ambulatory Visit (HOSPITAL_COMMUNITY): Payer: Medicaid Other | Admitting: Psychiatry

## 2019-05-31 ENCOUNTER — Telehealth (HOSPITAL_COMMUNITY): Payer: Self-pay | Admitting: Psychiatry

## 2019-05-31 ENCOUNTER — Other Ambulatory Visit: Payer: Self-pay

## 2019-05-31 NOTE — Telephone Encounter (Signed)
Sent link for video visit through [facility]. Patient did not sign in. Called the patient  twice for appointment scheduled today. The patient did not answer the phone. Left voice message to contact the office.  

## 2019-06-06 ENCOUNTER — Encounter: Payer: Self-pay | Admitting: Physician Assistant

## 2019-06-07 ENCOUNTER — Other Ambulatory Visit: Payer: Self-pay | Admitting: Emergency Medicine

## 2019-06-07 DIAGNOSIS — I10 Essential (primary) hypertension: Secondary | ICD-10-CM

## 2019-06-07 MED ORDER — SPIRONOLACTONE 25 MG PO TABS: ORAL_TABLET | ORAL | 0 refills | Status: AC

## 2019-07-04 ENCOUNTER — Encounter: Payer: Self-pay | Admitting: Physician Assistant

## 2019-07-12 ENCOUNTER — Other Ambulatory Visit (HOSPITAL_COMMUNITY): Payer: Self-pay | Admitting: Psychiatry

## 2019-07-12 MED ORDER — FLUOXETINE HCL 40 MG PO CAPS: ORAL_CAPSULE | ORAL | 0 refills | Status: DC

## 2019-10-12 ENCOUNTER — Other Ambulatory Visit (HOSPITAL_COMMUNITY): Payer: Self-pay | Admitting: Psychiatry

## 2019-10-12 MED ORDER — FLUOXETINE HCL 20 MG PO CAPS: ORAL_CAPSULE | ORAL | 0 refills | Status: DC

## 2019-10-12 NOTE — Telephone Encounter (Signed)
Ordered fluoxetine refill per request. Please contact the patient and make follow up appointment. I will not be able to fill anymore without evaluation.

## 2020-02-26 ENCOUNTER — Other Ambulatory Visit: Payer: Self-pay | Admitting: Obstetrics and Gynecology

## 2020-02-26 DIAGNOSIS — R921 Mammographic calcification found on diagnostic imaging of breast: Secondary | ICD-10-CM

## 2020-04-01 ENCOUNTER — Ambulatory Visit
Admission: RE | Admit: 2020-04-01 | Discharge: 2020-04-01 | Disposition: A | Discharge status: Home / Self Care | Payer: Medicaid Other | Source: Ambulatory Visit | Attending: Obstetrics and Gynecology | Admitting: Obstetrics and Gynecology

## 2020-04-01 ENCOUNTER — Other Ambulatory Visit: Payer: Self-pay

## 2020-04-01 DIAGNOSIS — R921 Mammographic calcification found on diagnostic imaging of breast: Secondary | ICD-10-CM

## 2020-06-01 ENCOUNTER — Other Ambulatory Visit: Payer: Self-pay

## 2020-06-01 ENCOUNTER — Encounter (HOSPITAL_COMMUNITY): Payer: Self-pay | Admitting: Emergency Medicine

## 2020-06-01 ENCOUNTER — Emergency Department (HOSPITAL_COMMUNITY)
Admission: EM | Admit: 2020-06-01 | Discharge: 2020-06-01 | Disposition: A | Discharge status: Home / Self Care | Payer: Medicaid Other | Attending: Emergency Medicine | Admitting: Emergency Medicine

## 2020-06-01 DIAGNOSIS — F1010 Alcohol abuse, uncomplicated: Secondary | ICD-10-CM | POA: Diagnosis not present

## 2020-06-01 DIAGNOSIS — R682 Dry mouth, unspecified: Secondary | ICD-10-CM | POA: Insufficient documentation

## 2020-06-01 DIAGNOSIS — Z5321 Procedure and treatment not carried out due to patient leaving prior to being seen by health care provider: Secondary | ICD-10-CM | POA: Diagnosis not present

## 2020-06-01 DIAGNOSIS — R0789 Other chest pain: Secondary | ICD-10-CM | POA: Diagnosis not present

## 2020-06-01 LAB — CBC WITH DIFFERENTIAL/PLATELET
Abs Immature Granulocytes: 0.03 10*3/uL (ref 0.00–0.07)
Basophils Absolute: 0.1 10*3/uL (ref 0.0–0.1)
Basophils Relative: 1 %
Eosinophils Absolute: 0.1 10*3/uL (ref 0.0–0.5)
Eosinophils Relative: 1 %
HCT: 41.5 % (ref 36.0–46.0)
Hemoglobin: 14.3 g/dL (ref 12.0–15.0)
Immature Granulocytes: 0 %
Lymphocytes Relative: 17 %
Lymphs Abs: 1.4 10*3/uL (ref 0.7–4.0)
MCH: 33.6 pg (ref 26.0–34.0)
MCHC: 34.5 g/dL (ref 30.0–36.0)
MCV: 97.4 fL (ref 80.0–100.0)
Monocytes Absolute: 0.5 10*3/uL (ref 0.1–1.0)
Monocytes Relative: 6 %
Neutro Abs: 6.2 10*3/uL (ref 1.7–7.7)
Neutrophils Relative %: 75 %
Platelets: 325 10*3/uL (ref 150–400)
RBC: 4.26 MIL/uL (ref 3.87–5.11)
RDW: 12 % (ref 11.5–15.5)
WBC: 8.2 10*3/uL (ref 4.0–10.5)
nRBC: 0 % (ref 0.0–0.2)

## 2020-06-01 LAB — BASIC METABOLIC PANEL
Anion gap: 11 (ref 5–15)
BUN: 10 mg/dL (ref 6–20)
CO2: 23 mmol/L (ref 22–32)
Calcium: 9.5 mg/dL (ref 8.9–10.3)
Chloride: 97 mmol/L — ABNORMAL LOW (ref 98–111)
Creatinine, Ser: 0.75 mg/dL (ref 0.44–1.00)
GFR, Estimated: >60 mL/min (ref >60)
Glucose, Bld: 111 mg/dL — ABNORMAL HIGH (ref 70–99)
Potassium: 4.1 mmol/L (ref 3.5–5.1)
Sodium: 131 mmol/L — ABNORMAL LOW (ref 135–145)

## 2020-06-01 NOTE — ED Triage Notes (Signed)
   Patient BIB EMS for chest tightness that started earlier tonight after eating CBD gummy and drinking alcohol.  Patient also complaining of dry mouth.

## 2020-06-01 NOTE — ED Notes (Signed)
Called Pt 3 times for vitals recheck and no answer. Registration also called and no answer

## 2020-06-02 ENCOUNTER — Ambulatory Visit (INDEPENDENT_AMBULATORY_CARE_PROVIDER_SITE_OTHER): Payer: Medicaid Other

## 2020-06-02 ENCOUNTER — Ambulatory Visit (INDEPENDENT_AMBULATORY_CARE_PROVIDER_SITE_OTHER): Payer: Medicaid Other | Admitting: Podiatry

## 2020-06-02 ENCOUNTER — Encounter: Payer: Self-pay | Admitting: Podiatry

## 2020-06-02 DIAGNOSIS — M79672 Pain in left foot: Secondary | ICD-10-CM | POA: Diagnosis not present

## 2020-06-02 DIAGNOSIS — M7672 Peroneal tendinitis, left leg: Secondary | ICD-10-CM

## 2020-06-02 MED ORDER — DICLOFENAC SODIUM 75 MG PO TBEC: 75.0000 mg | DELAYED_RELEASE_TABLET | Freq: Two times a day (BID) | ORAL | 2 refills | Status: DC

## 2020-06-04 LAB — I-STAT BETA HCG BLOOD, ED (MC, WL, AP ONLY): I-stat hCG, quantitative: <5 m[IU]/mL (ref <5)

## 2020-06-04 NOTE — Progress Notes (Signed)
Subjective:   Patient ID: Carla Torres, female   DOB: 54 y.o.   MRN: [id number]   HPI Patient presents stating she is getting a lot of pain in the plantar aspect of her left heel and it is gradually getting worse and has been going on for a number of months.  States that she is tried Mining engineer modifications ice without relief and she does smoke a pack a day and likes to be active if possible   Review of Systems  All other systems reviewed and are negative.       Objective:  Physical Exam Vitals and nursing note reviewed.  Constitutional:      Appearance: She is well-developed.  Pulmonary:     Effort: Pulmonary effort is normal.  Musculoskeletal:        General: Normal range of motion.  Skin:    General: Skin is warm.  Neurological:     Mental Status: She is alert.     Neurovascular status found to be intact muscle strength is adequate range of motion within normal limits.  Patient is found to have exquisite discomfort plantar aspect left heel at the insertional point of the tendon into the calcaneus with inflammation and fluid around the medial band.  Patient is found to have good digital perfusion well oriented x3     Assessment:  Acute plantar fasciitis left foot inflammation fluid of the medial     Plan:  H&P reviewed condition at great length discussed treatments and at this point did sterile prep and injected the fascia 3 mg Kenalog 5 mg Xylocaine and applied fascial brace to lift up the arch.  Discussed supportive shoes physical therapy and reappoint to recheck  X-rays indicate spur no indications of stress fracture arthritis

## 2020-06-10 ENCOUNTER — Other Ambulatory Visit: Payer: Self-pay | Admitting: Podiatry

## 2020-06-10 DIAGNOSIS — M7672 Peroneal tendinitis, left leg: Secondary | ICD-10-CM

## 2020-06-16 ENCOUNTER — Telehealth (INDEPENDENT_AMBULATORY_CARE_PROVIDER_SITE_OTHER): Payer: Medicaid Other | Admitting: Psychiatry

## 2020-06-16 ENCOUNTER — Other Ambulatory Visit: Payer: Self-pay

## 2020-06-16 ENCOUNTER — Ambulatory Visit (INDEPENDENT_AMBULATORY_CARE_PROVIDER_SITE_OTHER): Payer: Medicaid Other | Admitting: Podiatry

## 2020-06-16 ENCOUNTER — Encounter: Payer: Self-pay | Admitting: Psychiatry

## 2020-06-16 ENCOUNTER — Telehealth (HOSPITAL_COMMUNITY): Payer: Self-pay | Admitting: *Deleted

## 2020-06-16 ENCOUNTER — Encounter: Payer: Self-pay | Admitting: Podiatry

## 2020-06-16 DIAGNOSIS — F4310 Post-traumatic stress disorder, unspecified: Secondary | ICD-10-CM

## 2020-06-16 DIAGNOSIS — M7672 Peroneal tendinitis, left leg: Secondary | ICD-10-CM

## 2020-06-16 DIAGNOSIS — F331 Major depressive disorder, recurrent, moderate: Secondary | ICD-10-CM

## 2020-06-16 MED ORDER — FLUOXETINE HCL 20 MG PO CAPS: ORAL_CAPSULE | ORAL | 0 refills | Status: DC

## 2020-06-16 MED ORDER — CLONAZEPAM 1 MG PO TABS: 1.0000 mg | ORAL_TABLET | Freq: Every day | ORAL | 0 refills | Status: DC | PRN

## 2020-06-16 MED ORDER — FLUOXETINE HCL 40 MG PO CAPS: ORAL_CAPSULE | ORAL | 0 refills | Status: DC

## 2020-06-16 MED ORDER — TRIAMCINOLONE ACETONIDE 10 MG/ML IJ SUSP
10.0000 mg | Freq: Once | INTRAMUSCULAR | Status: AC
  Administered 2020-06-16: 10 mg

## 2020-06-16 NOTE — Telephone Encounter (Signed)
Patient called and [facility] stating she is needing refills for her Prozac and Klonopin and she only have 2 pills left and would like for provider to send med refills to [facility] pharmacy and this message was left on November 19 at 1:22pm after office hours.   Staff called patient and was not able to reach her and [facility] informing her that per her chart she have not seen provider since 2020 and provider has moved to another office and for her to call that office. Informed patient that she may need to schedule an appt but she will have to call that office to see what they say. Staff left provider new office number on patient voicemail to call with concerns.
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
## 2020-06-17 NOTE — Progress Notes (Signed)
Subjective:   Patient ID: Carla Torres, female   DOB: 54 y.o.   MRN: [id number]   HPI Patient states she is improving but she still has 1 area of discomfort on the outside of the left foot near   ROS      Objective:  Physical Exam  Vascular status intact with patient's peroneal tendon inflamed near its insertion into the base of the fifth metatarsal with minimal plantar pain noted      Assessment:  Peroneal tendinitis left that is improved from previous but still present with 1 area of acute discomfort     Plan:  Reviewed condition careful steroid injection administered in more distal direction 3 mg dexamethasone Kenalog 5 mg Xylocaine advised on ice therapy and rigid bottom shoes.  Reappoint as needed
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
## 2020-07-14 ENCOUNTER — Encounter: Payer: Self-pay | Admitting: Psychiatry

## 2020-07-14 ENCOUNTER — Telehealth (INDEPENDENT_AMBULATORY_CARE_PROVIDER_SITE_OTHER): Payer: Medicaid Other | Admitting: Psychiatry

## 2020-07-14 ENCOUNTER — Other Ambulatory Visit: Payer: Self-pay

## 2020-07-14 DIAGNOSIS — F330 Major depressive disorder, recurrent, mild: Secondary | ICD-10-CM

## 2020-07-14 DIAGNOSIS — F4310 Post-traumatic stress disorder, unspecified: Secondary | ICD-10-CM

## 2020-07-14 MED ORDER — CLONAZEPAM 1 MG PO TABS: 1.0000 mg | ORAL_TABLET | Freq: Every day | ORAL | 0 refills | Status: DC | PRN

## 2020-09-30 ENCOUNTER — Other Ambulatory Visit (HOSPITAL_COMMUNITY): Payer: Self-pay | Admitting: Psychiatry

## 2020-09-30 MED ORDER — FLUOXETINE HCL 20 MG PO CAPS: ORAL_CAPSULE | ORAL | 0 refills | Status: DC

## 2020-09-30 MED ORDER — FLUOXETINE HCL 40 MG PO CAPS: ORAL_CAPSULE | ORAL | 0 refills | Status: DC

## 2020-11-24 ENCOUNTER — Telehealth: Payer: Self-pay

## 2020-11-24 NOTE — Telephone Encounter (Signed)
LVM advising patient to call back and schedule TOC. 

## 2021-01-01 ENCOUNTER — Other Ambulatory Visit (HOSPITAL_COMMUNITY): Payer: Self-pay | Admitting: Psychiatry

## 2021-01-17 NOTE — ED Provider Notes (Signed)
 " [facility]  ED Provider Note  Carla Torres 55 y.o. female DOB: [birth year] MRN: [id number] History   Chief Complaint  Patient presents with   Leg Injury    hit by box; caused lac and bleeding -- bleeding controlled prior to EMS arrival; elevated BP reported of 220/110 on scene   55 year old female with past medical history of hypertension, unsure of last tetanus presenting the emergency department complaint of a laceration on the left lower leg.  Patient states she was at work when a metal box scraped her leg.  She noticed bleeding.  Her pressure was elevated in the 200s, and EMS was called and patient was transported here.  States bleeding was significant, unsure if still bleeding.  Denies numbness or tingling.  Has been ambulatory.  Denies injury elsewhere.       Past Medical History:  Diagnosis Date   Depression    Eczema    Hypertension     Past Surgical History:  Procedure Laterality Date   Appendectomy     Cardiac surgery     6 months   Cholecystectomy     Colon surgery  1994   Colonoscopy  1994    Social History   Substance and Sexual Activity  Alcohol Use Yes   Social History   Tobacco Use  Smoking Status Current Every Day Smoker   Packs/day: 0.50   Years: 35.00   Pack years: 17.50  Smokeless Tobacco Never Used   E-Cigarettes   Vaping Use     Start Date     Cartridges/Day     Quit Date     Social History   Substance and Sexual Activity  Drug Use Never   Tetanus up to date?: Unknown Immunizations Up to Date?: Unknown   Allergies  Allergen Reactions   Albuterol Rash    Hives and itchy throat   Codeine Nausea And Vomiting   Pravastatin Other    Hair loss    Home Medications   CLONAZEPAM  (KLONOPIN ) 1 MG TABLET    Take one tablet (1 mg dose) by mouth daily.   COLESTIPOL (COLESTID) 1 G TABLET    Take one tablet (1 g dose) by mouth daily.   DICLOFENAC  SODIUM (VOLTAREN ) 75 MG EC TABLET    Take 75  mg by mouth.   FLUOXETINE  (PROZAC ) 20 MG CAPSULE    Take one capsule (20 mg dose) by mouth daily.   FLUOXETINE  (PROZAC ) 40 MG CAPSULE    Take 60 mg (40 mg + 20 mg ) daily   FLUTICASONE  PROPIONATE (FLONASE ) 50 MCG/ACTUATION NASAL SPRAY    USE TWO SPRAYS by nasal route daily   SPIRONOLACTONE  (ALDACTONE ) 25 MG TABLET    TAKE ONE TABLET BY MOUTH DAILY    Review of Systems   Review of Systems  Constitutional: Negative for chills and fever.  Respiratory: Negative for cough and shortness of breath.   Cardiovascular: Negative for chest pain and palpitations.  Gastrointestinal: Negative for abdominal pain, nausea and vomiting.  Musculoskeletal: Negative for arthralgias and back pain.  Skin: Positive for wound. Negative for color change and rash.  Neurological: Negative for seizures, syncope, weakness and numbness.  All other systems reviewed and are negative.   Physical Exam   ED Triage Vitals  BP 01/17/21 1444 (!) 143/93  Heart Rate 01/17/21 1441 68  Resp 01/17/21 1440 18  SpO2 01/17/21 1441 98 %  Temp 01/17/21 1441 98.2 F (36.8 C)    Physical Exam  Nursing note and vitals reviewed. Constitutional: She appears well-developed and well-nourished. She does not appear ill and no respiratory distress. Not diaphoretic. HENT:  Head: Normocephalic and atraumatic.  Cardiovascular: Normal rate and regular rhythm.  No audible murmur.  Pulmonary/Chest: No respiratory distress. Respiratory effort normal. No wheezing.  Abdominal: Soft. There is no abdominal tenderness. Abdomen not distended.  Musculoskeletal:     Comments: Superficial 1.5cm laceration L lower medial calf, no tendon involvement, sensation intact distally, +2 dp pulse, compartments soft   Neurological: She is alert. Sensation intact to light touch, bilateral upper and lower extremities.  Skin: Skin is warm. Not diaphoretic. No cyanosis.    ED Course   Lab results: No data to display  Imaging: No data to  display  ECG: ECG Results   None                     Pre-Sedation Lac Repair  Date/Time: 01/17/2021 5:23 PM Performed by: Justus Jackson, DO Authorized by: Justus Jackson, DO   Consent:    Consent obtained:  Verbal   Consent given by:  Patient   Risks discussed:  Infection, need for additional repair, nerve damage, pain, poor cosmetic result and poor wound healing   Alternatives discussed:  Referral Universal protocol:    Immediately prior to procedure, a time out was called: no     Patient identity confirmed:  Verbally with patient Anesthesia (see MAR for exact dosages):    Anesthesia method:  None Laceration details:    Location:  Leg   Leg location:  L lower leg   Length (cm):  1.5 Repair type:    Repair type:  Simple Pre-procedure details:    Preparation:  Patient was prepped and draped in usual sterile fashion Exploration:    Hemostasis achieved with:  Direct pressure   Wound exploration: wound explored through full range of motion and entire depth of wound probed and visualized     Wound extent: areolar tissue violated     Wound extent: fascia not violated, no foreign body, no signs of injury, no nerve damage, no tendon damage, no underlying fracture and no vascular damage     Contaminated: no   Treatment:    Area cleansed with:  Saline   Amount of cleaning:  Standard   Irrigation solution:  Sterile saline   Irrigation volume:  500   Irrigation method:  Pressure wash Skin repair:    Repair method:  Tissue adhesive Approximation:    Approximation:  Close   Vermilion border: well-aligned   Post-procedure details:  Post-procedure details:  Was the procedure successful? Yes. Post procedure diagnosis: laceration. Estimated Blood loss: None Specimen Collected? No.   Patient tolerance of procedure:  Tolerated well, no immediate complications     Dressing:  Adhesive bandage   Patient tolerance of procedure:  Tolerated well, no immediate  complications      MDM Number of Diagnoses or Management Options Laceration of left lower leg, initial encounter Need for Tdap vaccination Diagnosis management comments: 55 year old female presenting the emergency department with concern for superficial laceration.  Minimal gaping noted.  No significant bony tenderness.  Neurovascular intact.  Do not suspect underlying fracture.  No muscular involvement.  Has been ambulatory.  Bleeding controlled.  Will irrigate and repair with glue. tdap updated.  Coding      Provider Communication  New Prescriptions   No medications on file    Modified Medications   No medications on file  Discontinued Medications   No medications on file    Clinical Impression   Final diagnoses:  Laceration of left lower leg, initial encounter  Need for Tdap vaccination    ED Disposition    ED Disposition  Discharge   Condition  Stable   Comment  --                Follow-up Information    [facility] Emergency Department.   Specialty: Emergency Medicine Comments: As needed, If symptoms worsen Contact information: [address] [facility] [place]  [phone]               Electronically signed by:   Justus Jackson, DO 01/17/21 1726  "

## 2021-03-18 IMAGING — MG DIGITAL DIAGNOSTIC BILAT W/ TOMO W/ CAD
6 of 10 series · 6 of 26 positions shown · non-contrast
Comparison: Previous exam(s).

CLINICAL DATA: 53-year-old female presenting for annual exam as
well as follow-up of probably benign left breast calcifications that
were first evaluated in September 2016.

EXAM:
DIGITAL DIAGNOSTIC BILATERAL MAMMOGRAM WITH TOMO AND CAD

[L ML]
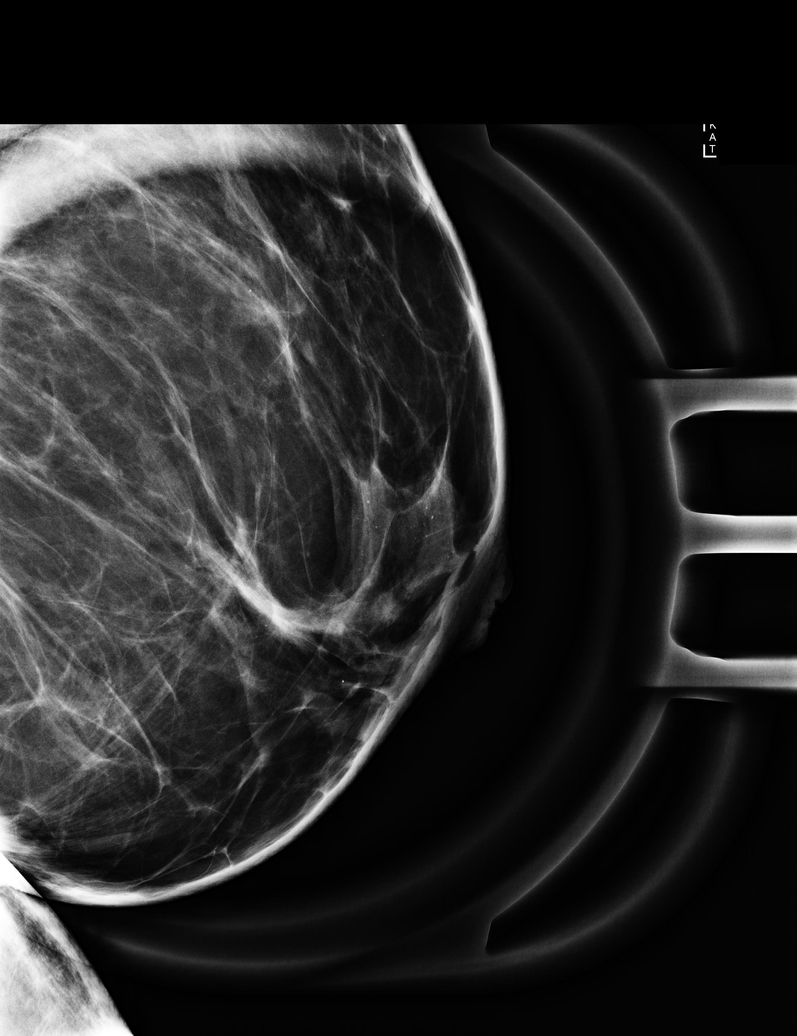

[L CC]
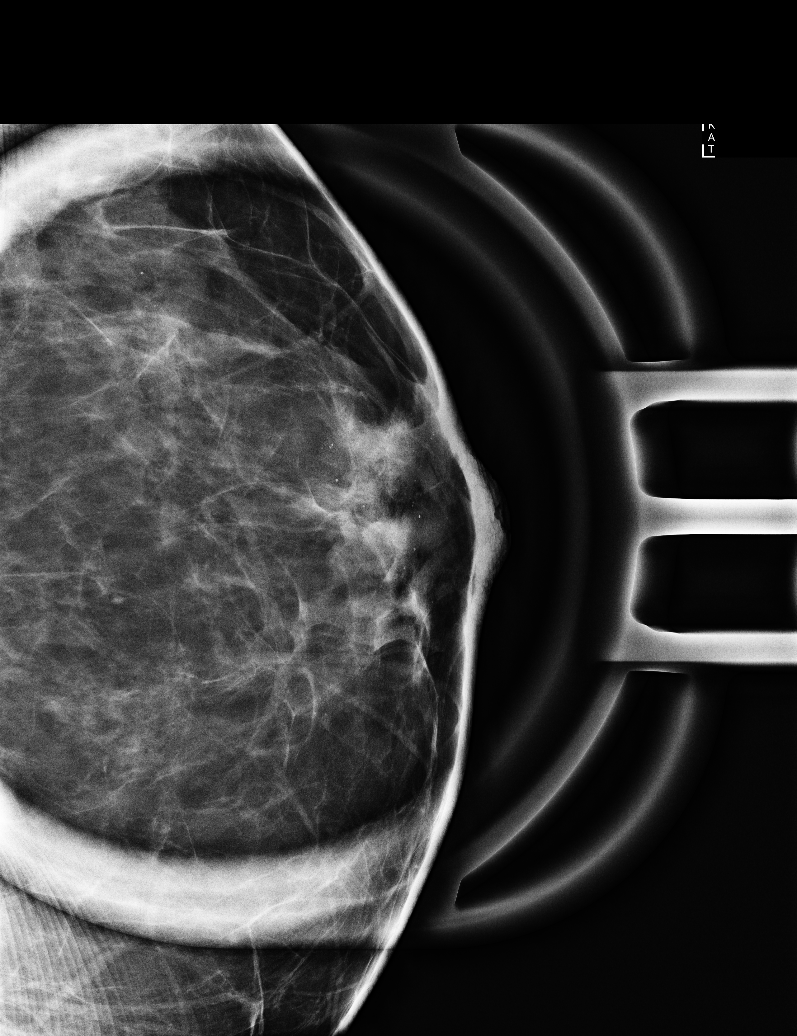

[R MLO synth-2D]
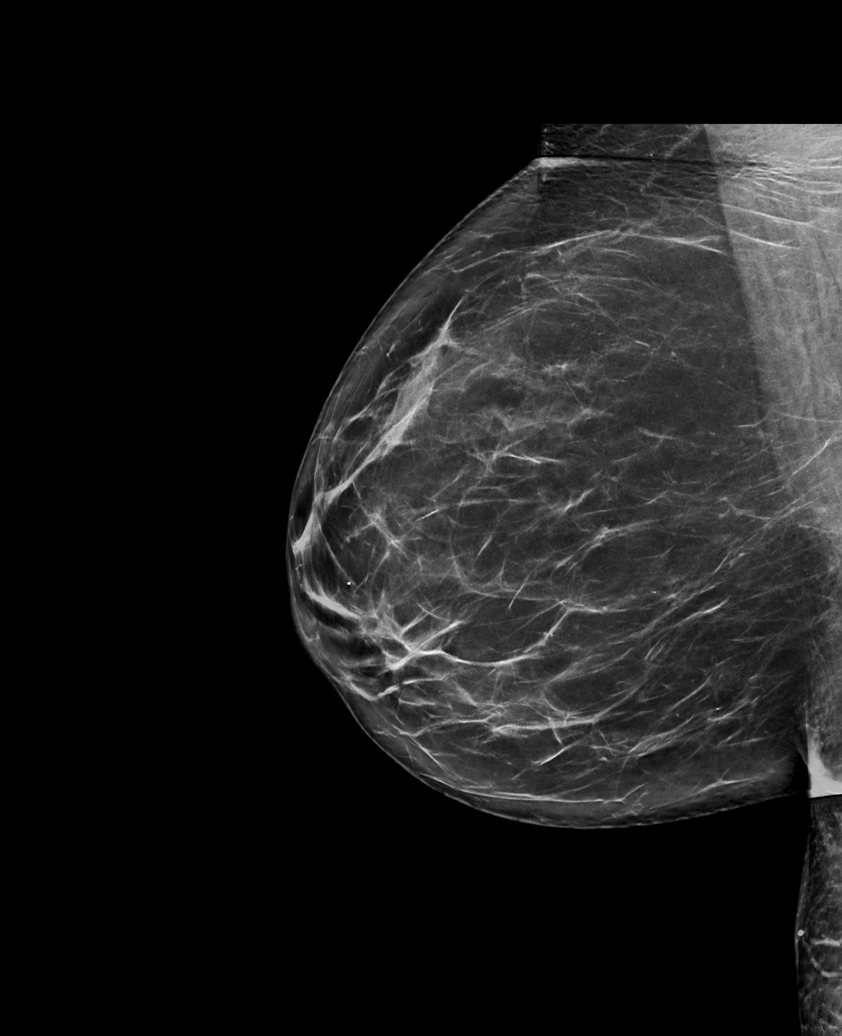

[L CC synth-2D]
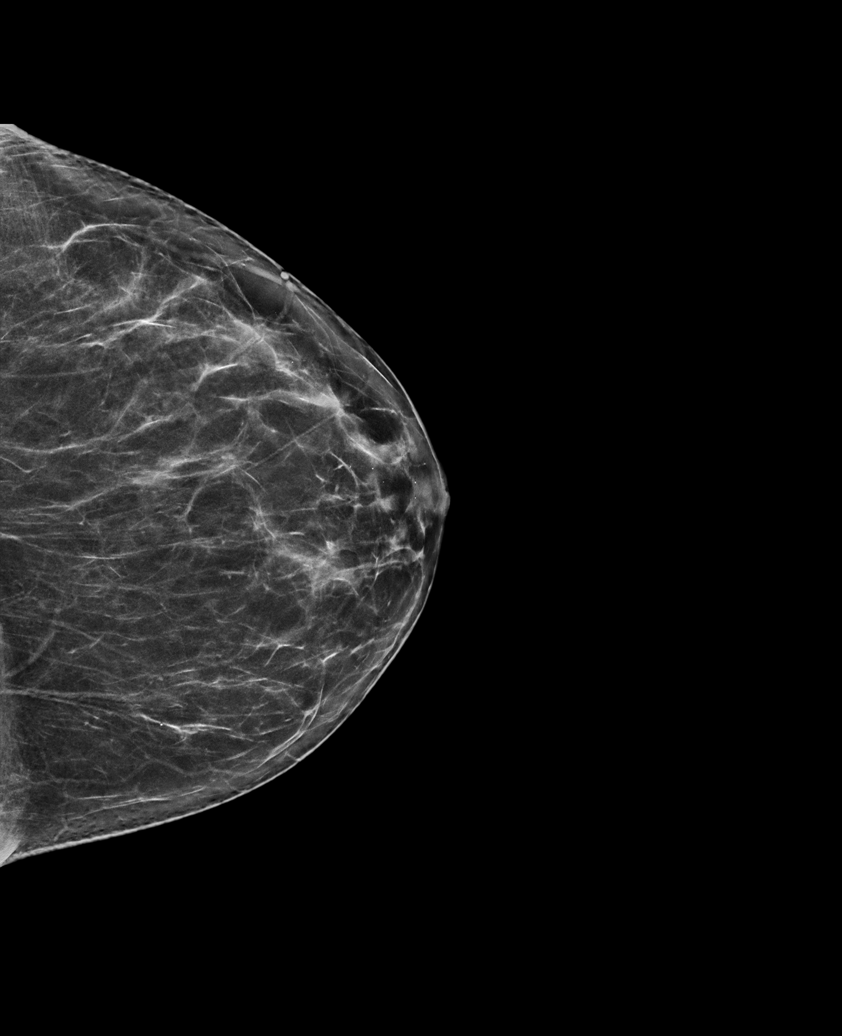

[R CC synth-2D]
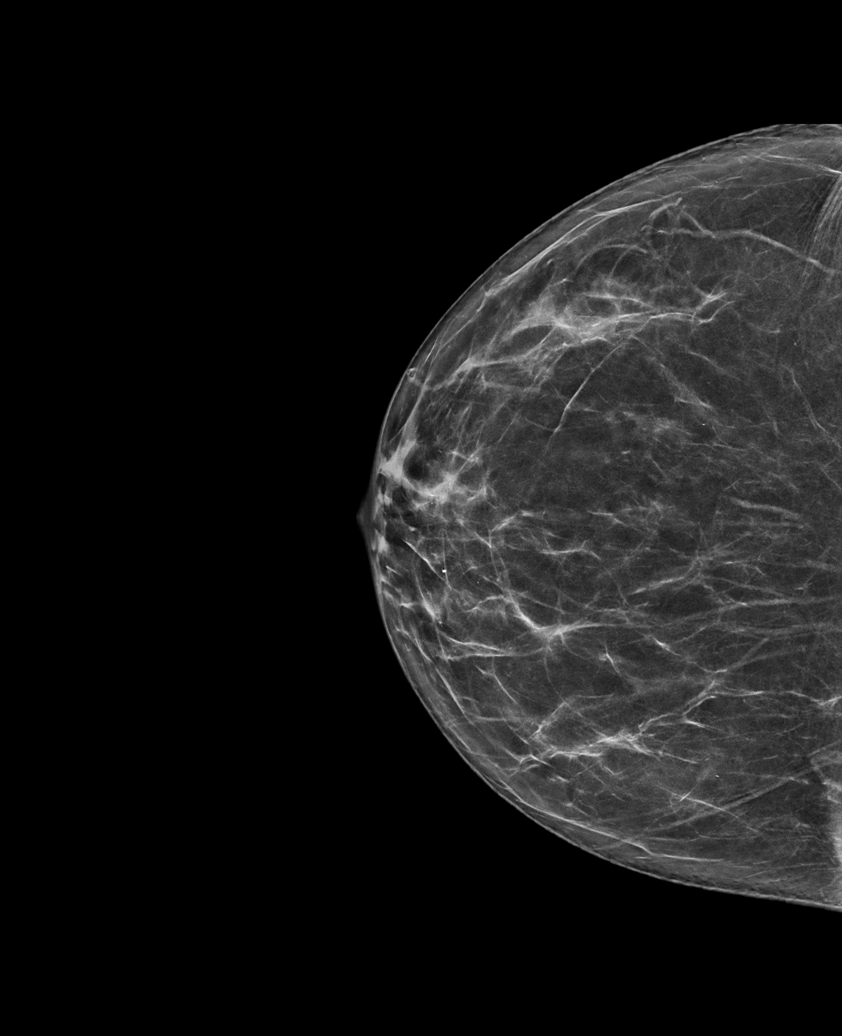

[L MLO synth-2D]
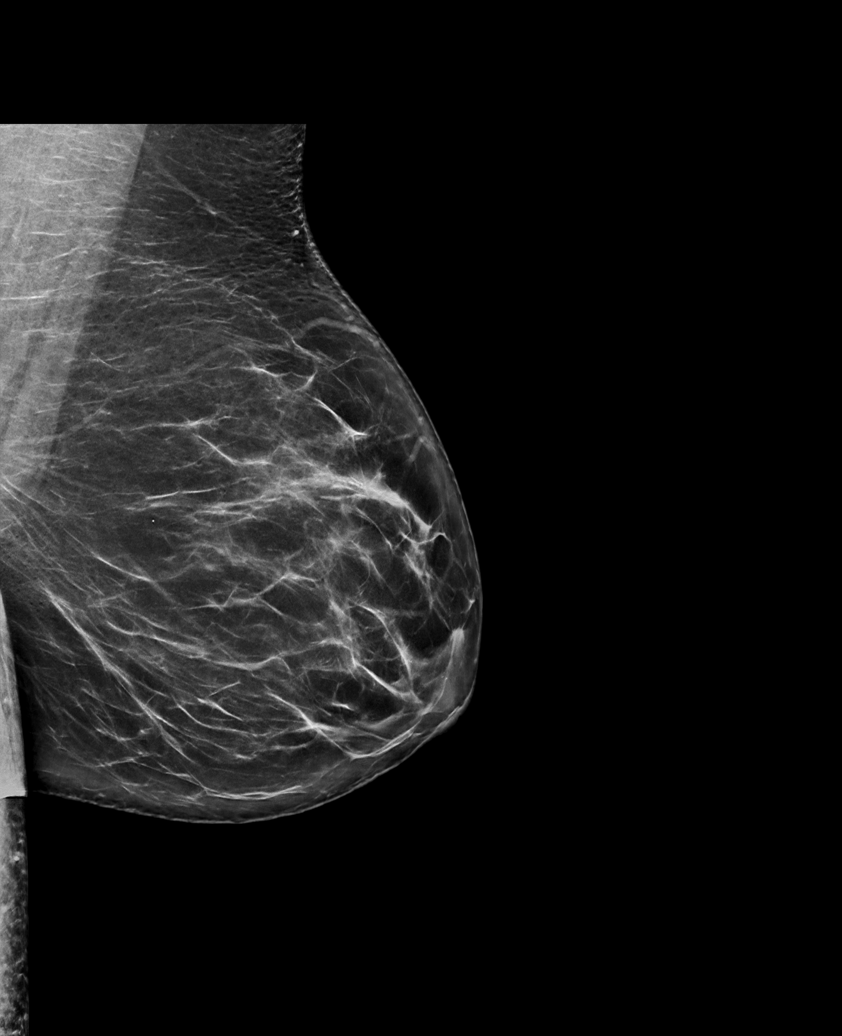

[6 of 26 positions shown; findings below may reference images not displayed]

ACR Breast Density Category b: There are scattered areas of
fibroglandular density.
FINDINGS: Right breast: No suspicious mass, distortion, or microcalcifications
are identified to suggest presence of malignancy.

Left breast: Spot 2D magnification views of the left breast
calcifications performed in addition to standard views. In the
retroareolar left breast there are loosely grouped punctate
calcifications which are not significantly changed compared to the
prior mammogram. There are no new suspicious findings in the left
breast.

Mammographic images were processed with CAD.
IMPRESSION: Stable calcifications in the retroareolar left breast, which given
stability for over 2 years are considered benign. No mammographic
evidence of malignancy in the bilateral breasts.

RECOMMENDATION:
Screening mammogram in one year.(Code:[id number])

I have discussed the findings and recommendations with the patient.
If applicable, a reminder letter will be sent to the patient
regarding the next appointment.

BI-RADS CATEGORY  2: Benign.

## 2023-10-17 NOTE — Progress Notes (Signed)
 Subjective:  Carla Torres is a 58 y.o. female here for medication refill.  She is still unemployed. Has started a job and they let her go after 1 1/2 weeks. She starts shaking and panics and can't remember what they are trying to teach her. She is having issues with her daughter who she believes is autistic. She will have testing soon.   She states she saw Psychiatry and feels they labeled her with diagnoses that are not appropriate such as cocaine dependence (states she only did this in her 33's). She states she is not dependent on Benzos. Only using occasionally.   Having numbness and tingling in her hands, shaking in hands, and memory issues. These are all affecting Is requesting to see a  Review of Systems - All other systems were reviewed and are negative unless stated in HPI.  Family History  Problem Relation Age of Onset   Heart disease Mother    Ovarian cancer Mother    No Known Problems Father    Past Medical History:  Diagnosis Date   Depression    Eczema    Hypertension    Past Surgical History:  Procedure Laterality Date   Appendectomy     Cardiac surgery     6 months   Cholecystectomy     Colon surgery  1994   Colonoscopy  1994   Pediatric History  Patient Parents   Not on file   Other Topics Concern   Not on file  Social History Narrative   Not on file    Objective:   BP 134/69 (BP Location: Left Lower Arm, Patient Position: Sitting)   Pulse 62   Temp 97.3 F (36.3 C) (Temporal)   Resp 18   Ht 5' 5 (1.651 m)   Wt 183 lb 12.8 oz (83.4 kg)   SpO2 95%   Breastfeeding No   BMI 30.59 kg/m  Gen: Alert, oriented, non toxic, and well hydrated.  No signs of acute distress. Head: Normocephalic.  Atraumatic.  Sclera anicteric.  Conjunctiva clear. Neck: Supple, no lymphadenopathy Respiratory:  Lungs clear to auscultation.  No use of accessory muscles. Cardiovascular: Regular rate and rhythm.  No murmurs noted Abdominal:  Soft, non  tender, non distended.  No hepatosplenomegally Neuro: Cranial nerves intact grossly.  No loss of strength, sensation Extremities:  Full range of motion.  No cyanosis, clubbing, or edema. Skin:  No rashes noted.  No bleeding or bruising noted. Psych: Oriented, alert.  Normal mood and affect.  Assessment:     ICD-10-CM   1. Memory deficit  R41.3 Ambulatory referral to Neurology    Vitamin B12    2. Tremor  R25.1 Ambulatory referral to Neurology    Vitamin B12    3. Paresthesia  R20.2 Ambulatory referral to Neurology    Vitamin B12    4. Severe episode of recurrent major depressive disorder, without psychotic features  (*)  F33.2 fluoxetine  (PROZAC ) 40 MG capsule    FLUoxetine  (PROZAC ) 20 mg capsule    buPROPion hcl (WELLBUTRIN XL) 300 mg 24 hr tablet      Plan:   Orders Placed This Encounter  Procedures   Vitamin B12   Ambulatory referral to Neurology   - B12 today - referral to neurology - Continue taking medications as previously prescribed.   - Follow up for refill in 3 months. - If symptoms worsen or persist, advised pt to contact office for re-evaluation - I discussed this diagnosis with the patient and discussed  the treatment plan with them.  This treatment plan is also outlined in the Patient Instructions and a copy of this was provided to the patient.  - Patient  verbalized to me that they understood what their problem is, what they need to do about it, and why it is important that they do it.  The patient/family voices understanding of all medications. No barriers to adherence were noted. Patient is taking all medications as prescribed and is tolerating well.  Plan for follow-up as discussed or as needed if any worsening symptoms or change in condition.

## 2023-11-08 NOTE — Progress Notes (Signed)
 Subjective:   Carla Torres is a 58 y.o. female here for evaluation of  seizures.   She is seeing Neurology states she was told she has a seizure disorder. I do not have records from neurology to review. She is taking Trazodone  at bedtime. On lamotrigine  and tapering up to get to a 150mg  daily. He recommended testing for ADD. Recommends she go to [facility]. She went to see them but they state they will not take her insurance. They are in [facility].   Having mucous in the morning and blood tinged sputum. She is trying to quit smoking. Taking Allegra . Drinking a lot of water. She will cough some int he am to get up mucous. Denies Fever, unexpected WL. Denies known exposure to TB.   States she can't remember anything. Having a hard time driving. Cars are honking at her. She states she can't stay focused to keep a job.   Review of Systems - All other systems were reviewed and are negative unless stated in HPI.  Family History  Problem Relation Age of Onset   Heart disease Mother    Ovarian cancer Mother    No Known Problems Father    Past Medical History:  Diagnosis Date   Anxiety    Depression    Disease of thyroid gland    Eczema    GERD (gastroesophageal reflux disease)    Hyperlipidemia    Hypertension    Past Surgical History:  Procedure Laterality Date   Appendectomy     Cardiac surgery     6 months   Cholecystectomy     Colon surgery  1994   Colonoscopy  1994   Pediatric History  Patient Parents   Not on file   Other Topics Concern   Not on file  Social History Narrative   Not on file    Objective:   BP (!) 111/54 (BP Location: Left Lower Arm, Patient Position: Sitting)   Pulse 60   Temp 97.3 F (36.3 C) (Temporal)   Resp 18   Ht 5' 5 (1.651 m)   Wt 183 lb 3.2 oz (83.1 kg)   SpO2 93%   Breastfeeding No   BMI 30.49 kg/m  Gen: Alert, oriented, non toxic, and well hydrated.  No signs of acute distress. Head:  Normocephalic.  Atraumatic.  PERRLA.  Sclera anicteric.  Eyes: Extraocular movements intact.  Conjunctiva clear.  No foreign bodies noted. Ears:  Tympanic membranes clear.  Canals clear. Pharynx: No erythema or tonsillar hypertrophy.  Uvula midline. Neck: Supple. No lymphadenopathy Respiratory:  Lungs clear to auscultation.  No use of accessory muscles. Cardiovascular: Regular rate and rhythm.  No murmurs noted Abdominal:  Soft, non tender, non distended.  No hepatosplenomegally Neuro: Cranial nerves intact grossly.  No loss of strength, sensation Extremities:  Full range of motion.  No cyanosis, clubbing, or edema. Skin:  No rashes noted Psych: Oriented, alert.  Assessment:     ICD-10-CM   1. Attention deficit  R41.840 Ambulatory referral to Psychiatry    2. Memory deficit  R41.3     3. Severe episode of recurrent major depressive disorder, without psychotic features (*)  F33.2       Plan:  - Stop wellbutrin. Skip every other day for one week then stop. Cut down to 40mg  on Prozac  for a month then cut down to 20mg  daily.  Follow up with Neurology and [facility] psychiatry. Get in for testing for ADHD. - Take all  medications as prescribed. - Return to clinic to be reevaluated if symptoms worsen, persist, change, or if you have any other concerns. - I discussed this diagnosis with the patient and discussed the treatment plan with them.  This treatment plan is also outlined in the Patient Instructions and a copy of this was provided to the patient.  - Patient  verbalized to me that they understood what their problem is, what they need to do about it, and why it is important that they do it.  The patient/family voices understanding of all medications. No barriers to adherence were noted. Patient is taking all medications as prescribed and is tolerating well.  Plan for follow-up as discussed or as needed if any worsening symptoms or change in condition.

## 2024-02-28 NOTE — Progress Notes (Signed)
 Subjective:  Carla Torres is a 58 y.o. female here for evaluation of sinus infection.   Has had a tickle in throat for a while. Started feeling bad end of week last week. Left ear is aching. Fatigue. Wants to sleep all the time. Facial pressure. Pressures behind eyes.Has taken sinus OTC meds.   Review of Systems - All other systems were reviewed and are negative unless stated in HPI.  Family History  Problem Relation Age of Onset   Heart disease Mother    Ovarian cancer Mother    No Known Problems Father    Past Medical History:  Diagnosis Date   Anxiety    Depression    Disease of thyroid gland    Eczema    GERD (gastroesophageal reflux disease)    Hyperlipidemia    Hypertension    Past Surgical History:  Procedure Laterality Date   Appendectomy     Cardiac surgery     6 months   Cholecystectomy     Colon surgery  1994   Colonoscopy  1994   Pediatric History  Patient Parents   Not on file   Other Topics Concern   Not on file  Social History Narrative   Not on file    Objective:   BP 132/81 (BP Location: Left Upper Arm, Patient Position: Sitting)   Pulse 62   Temp 98.1 F (36.7 C) (Temporal)   Resp 18   Ht 5' 5 (1.651 m)   Wt 188 lb 12.8 oz (85.6 kg)   SpO2 95%   Breastfeeding No   BMI 31.42 kg/m  Gen: Alert, oriented, non toxic, and well hydrated.  No signs of acute distress. Head: Normocephalic.  Atraumatic.  Sclera anicteric.  Sinus pressure at frontal and maxillary sinuses with percussion. Eyes: Extraocular movements intact.  Conjunctiva clear. Ears:  Tympanic membranes clear.  Canals clear. Pharynx: No erythema or tonsillar hypertrophy Neck: Supple.  No lymphadenopathy Respiratory:  Lungs clear to auscultation.  No use of accessory muscles. Cardiovascular: Regular rate and rhythm.  No murmurs noted Abdominal:  Soft, non tender, non distended.  No hepatosplenomegally Neuro: Cranial nerves intact grossly.  No loss of  strength, sensation Extremities:  Full range of motion.   Skin:  No rashes noted. Psych: Oriented, alert.  Assessment:   1. Acute non-recurrent maxillary sinusitis  clarithromycin (BIAXIN) 500 mg tablet   fluconazole (DIFLUCAN) 150 mg tablet   methylPREDNISolone  acetate (DEPO-MEDROL ) injection 80 mg    2. Antibiotic-induced yeast infection  fluconazole (DIFLUCAN) 150 mg tablet      Plan:  - depo medrol  in office - biaxin as directed - diflucan if needed - Take all the medication prescribed.  Given printed antibiotic to only take if symptoms persist for 7-10 days total or worsen with fever. - Given depomedrol for symptomatic relief. - Take nasal decongestant or otc mucinex for symptomatic relief. - Take zyrtec or claritin or allegra to help with allergic symptoms. - Rest and stay hydrated. - Infection may take several days to weeks to resolve.   - Take cough syrup as directed. - If symptoms worsen or you have concerns, return to clinic to be re-evaluated. - I discussed this diagnosis with the patient and discussed the treatment plan with them.  This treatment plan is also outlined in the Patient Instructions and a copy of this was provided to the patient.

## 2024-06-20 NOTE — Telephone Encounter (Signed)
 Patient ID:  Carla Torres is a 58 y.o. (DOB 10-30-1965) female  E-Visit Assessment   1. Acute cystitis without hematuria    E-Visit Plan  MDM: Nitrofurantoin sent to pharmacy to treat UTI   Patient's Medications       * Accurate as of June 20, 2024 11:55 AM. Reflects encounter med changes as of last refresh          New Prescriptions      Instructions  nitrofurantoin (macrocrystal-monohydrate) 100 mg capsule Commonly known as: MACROBID  100 mg, Oral, 2 times a day       Continued Medications      Instructions  cholestyramine 4 g packet Commonly known as: QUESTRAN  4 g, Oral, 2 times a day   DULoxetine  HCl 60 mg capsule Commonly known as: CYMBALTA   60 mg, Daily   fluticasone  propionate 50 mcg/actuation nasal spray Commonly known as: FLONASE   USE TWO SPRAYS INTO EACH NOSTRIL DAILY   HAIR SKIN & NAILS PO  Take by mouth.   ibuprofen  600 mg tablet Commonly known as: ADVIL ,MOTRIN   600 mg, 3 times a day as needed   lamoTRIgine  150 mg tablet Commonly known as: LAMICTAL   150 mg, Daily   primidone  50 mg tablet Commonly known as: MYSOLINE   50 mg, 2 times a day   spironolactone  25 mg tablet Commonly known as: ALDACTONE   25 mg, Oral, Every 24 hours scheduled   traZODone  100 mg tablet Commonly known as: DESYREL   100 mg, At bedtime   VITAMIN A PO  7,500 mcg   VITAMIN E PO  450 mg   VRAYLAR 1.5 MG capsule Generic drug: cariprazine  1.5 mg, At bedtime        Risk, benefits, and alternatives were provided through patient instructions given to the patient electronically.  If any worsening symptoms or lack of improvement, the patient will seek immediate medical care.  E-Visit History  HPI is reviewed through E-Visit questionnaire.  Patient is appropriate for E-Visit and consented online understanding potential risks, benefits and alternatives of E-Visit delivery of care.  Patient Active Problem List   Diagnosis Date Noted   Borderline  personality disorder (*) 09/28/2023   Posttraumatic stress disorder 09/28/2023   Generalized anxiety disorder 09/28/2023   Panic disorder with agoraphobia 09/28/2023   Alcohol abuse, episodic 09/28/2023   Nicotine  dependence due to vaping tobacco product 09/28/2023   Hyperlipidemia 08/12/2020   Hypothyroidism 08/12/2020   Abnormal cervical Papanicolaou smear 12/27/2017   Mood disorder 09/17/2016   HTN (hypertension) 09/17/2015    Last Assessment & Plan:  New to provider, ongoing for pt.  Currently well controlled on Spironolactone .  Asymptomatic.  Since pt doesn't have insurance will hold off on labs at this time.  Will plan to get labs done at upcoming visit when she has insurance    Anxiety 04/06/2014   GERD (gastroesophageal reflux disease) 04/10/2013   Allergies[1]  E-Visit Required Time Documentation  I spent 5 minutes on this issue.         [1] Allergies Allergen Reactions   Albuterol Rash    Hives and itchy throat   Codeine Nausea And Vomiting   Pravastatin Other    Hair loss  *Some images could not be shown.

## 2024-08-16 NOTE — Progress Notes (Signed)
 "  [facility] Video Visit   Patient ID:  Carla Torres is a 59 y.o. 01/30/1966 female. Place of service: patient home Patient has been advised as to the limitations and limited nature of physical exam due to nature of a video visit, the possibility of privacy risk in the use of a video visit, and that the healthcare provider may recommend visiting a healthcare clinic for in-person care and follow up.  Video Visit Assessment and Plan   1. Acute bacterial sinusitis (Primary) -     amoxicillin -clavulanate (AUGMENTIN ) 875-125 mg per tablet; Take one tablet by mouth 2 (two) times daily for 7 days., Starting Thu 08/16/2024, Until Thu 08/23/2024, Normal -     azelastine (ASTELIN) 0.1 % nasal spray; one spray by Both Nostrils route 2 (two) times daily for 30 days. Use in each nostril as directed, Starting Thu 08/16/2024, Until Sat 09/15/2024, Normal -     fluticasone  propionate (FLONASE ) 50 mcg/actuation nasal spray; two sprays by Nasal route daily for 30 days., Starting Thu 08/16/2024, Until Sat 09/15/2024, Normal -     predniSONE (DELTASONE) 20 mg tablet; Take two tablets (40 mg dose) by mouth daily for 3 days., Starting Thu 08/16/2024, Until Sun 08/19/2024, Normal 2. Acute bronchitis, unspecified organism   Medicine sent to pharmacy Declines albuterol Humidified air/steam inhalation Neti pot/saline rinses Increase water intake Consider Vitamin C, Zinc daily Deep breathing exercises and rest as needed Follow up with primary care provider in 2-3 days as needed Any worsening symptoms please go to nearest urgent care or emergency- not limited to but including any chest pain, shortness of breath, racing heart/palpations, lightheadedness, weakness, pain with deep breathing Patient aware to message me with any questions  Patient's Medications    Patient's Medications       * Accurate as of August 16, 2024  2:38 PM. Reflects encounter med changes as of last refresh          New  Prescriptions      Instructions  amoxicillin -clavulanate 875-125 mg per tablet Commonly known as: AUGMENTIN   1 tablet, Oral, 2 times a day   azelastine 0.1 % nasal spray Commonly known as: ASTELIN  1 spray, Both Nostrils, 2 times a day, Use in each nostril as directed   predniSONE 20 mg tablet Commonly known as: DELTASONE  40 mg, Oral, Daily       Continued Medications      Instructions  amphetamine-dextroamphetamine 10 mg tablet Commonly known as: ADDERALL  1 tablet, 2 times a day   cholestyramine 4 g packet Commonly known as: QUESTRAN  4 g, Oral, 2 times a day   clonazePAM  0.5 mg tablet Commonly known as: KLONOPIN   0.5 mg, Daily   guanFACINE  ER 2 mg Tb24 Commonly known as: INTUNIV   2 mg, At bedtime   HAIR SKIN & NAILS PO  Take by mouth.   ibuprofen  600 mg tablet Commonly known as: ADVIL ,MOTRIN   600 mg, 3 times a day as needed   lamoTRIgine  150 mg tablet Commonly known as: LAMICTAL   150 mg, Daily   primidone  50 mg tablet Commonly known as: MYSOLINE   50 mg, 2 times a day   spironolactone  25 mg tablet Commonly known as: ALDACTONE   25 mg, Oral, Every 24 hours scheduled   traZODone  100 mg tablet Commonly known as: DESYREL   100 mg, At bedtime   VITAMIN A PO  7,500 mcg   VITAMIN E PO  450 mg   VRAYLAR 1.5 MG capsule Generic drug:  cariprazine  1.5 mg, At bedtime       Modified Medications      Instructions  DULoxetine  HCl 30 mg capsule Commonly known as: CYMBALTA  What changed: Another medication with the same name was removed. Continue taking this medication, and follow the directions you see here.  30 mg, Daily   * fluticasone  propionate 50 mcg/actuation nasal spray Commonly known as: FLONASE  What changed: Another medication with the same name was added. Make sure you understand how and when to take each.  USE TWO SPRAYS INTO EACH NOSTRIL DAILY   * fluticasone  propionate 50 mcg/actuation nasal spray Commonly known as: FLONASE  What  changed: You were already taking a medication with the same name, and this prescription was added. Make sure you understand how and when to take each.  2 sprays, Nasal, Daily      * * This list has 2 medication(s) that are the same as other medications prescribed for you. Read the directions carefully, and ask your doctor or other care provider to review them with you.              Risk, benefits, and alternatives were provided through patient instructions given to the patient electronically and during the video interaction.  If any worsening symptoms or lack of improvement, the patient will seek immediate medical care.  Video Visit History   HPI Chief Complaint  Patient presents with   URI    Symptoms x 7 days, fatigue, dizziness, sinus pressure, nasal passage is super dry, PND, occasional cough with wheezing, no fever, no SOB, taking tylenol  as needed, no Covid/Flu testing done   One week with persistent nasal congestions, sinus pressure, PND, fatigue, headache, coughing at times and some wheezing-occasionally. No cp/sob. Taking otc medicine with some relief.    Reviewed and updated this visit by provider: Tobacco  Allergies  Meds  Problems        ROS: As documented in the history above, all other relevant system complaints were negative.  Video Visit Objective Findings  Examination conducted with the use of video cameras/computer monitors. Vital signs and other aspects of physical exam are limited due to the nature of this encounter.   Constitutional: No apparent acute distress noted during the video interaction; Alert and oriented with normal mentation and verbally interactive. No conversational dyspnea or work of breathing Mood: Appears appropriate to situation.   No LOS data to display   *Some images could not be shown."

## 2024-08-29 ENCOUNTER — Ambulatory Visit (HOSPITAL_COMMUNITY)
Admission: EM | Admit: 2024-08-29 | Discharge: 2024-08-29 | Disposition: A | Discharge status: Other Acute Inpatient Hospital | Attending: Student in an Organized Health Care Education/Training Program | Admitting: Student in an Organized Health Care Education/Training Program

## 2024-08-29 ENCOUNTER — Inpatient Hospital Stay (HOSPITAL_COMMUNITY): Admission: AD | Admit: 2024-08-29 | Discharge: 2024-08-30 | DRG: 885 | Disposition: A | Source: Intra-hospital

## 2024-08-29 ENCOUNTER — Encounter (HOSPITAL_COMMUNITY): Payer: Self-pay | Admitting: Emergency Medicine

## 2024-08-29 DIAGNOSIS — Z635 Disruption of family by separation and divorce: Secondary | ICD-10-CM

## 2024-08-29 DIAGNOSIS — Z636 Dependent relative needing care at home: Secondary | ICD-10-CM | POA: Insufficient documentation

## 2024-08-29 DIAGNOSIS — Z5941 Food insecurity: Secondary | ICD-10-CM

## 2024-08-29 DIAGNOSIS — Z79899 Other long term (current) drug therapy: Secondary | ICD-10-CM

## 2024-08-29 DIAGNOSIS — F17210 Nicotine dependence, cigarettes, uncomplicated: Secondary | ICD-10-CM | POA: Diagnosis present

## 2024-08-29 DIAGNOSIS — Z59868 Other specified financial insecurity: Secondary | ICD-10-CM

## 2024-08-29 DIAGNOSIS — Z8249 Family history of ischemic heart disease and other diseases of the circulatory system: Secondary | ICD-10-CM

## 2024-08-29 DIAGNOSIS — F332 Major depressive disorder, recurrent severe without psychotic features: Principal | ICD-10-CM | POA: Diagnosis present

## 2024-08-29 DIAGNOSIS — E785 Hyperlipidemia, unspecified: Secondary | ICD-10-CM | POA: Diagnosis present

## 2024-08-29 DIAGNOSIS — Z23 Encounter for immunization: Secondary | ICD-10-CM

## 2024-08-29 DIAGNOSIS — Z59869 Financial insecurity, unspecified: Secondary | ICD-10-CM | POA: Insufficient documentation

## 2024-08-29 DIAGNOSIS — F4310 Post-traumatic stress disorder, unspecified: Secondary | ICD-10-CM | POA: Insufficient documentation

## 2024-08-29 DIAGNOSIS — Z885 Allergy status to narcotic agent status: Secondary | ICD-10-CM

## 2024-08-29 DIAGNOSIS — I10 Essential (primary) hypertension: Secondary | ICD-10-CM | POA: Diagnosis present

## 2024-08-29 DIAGNOSIS — Z888 Allergy status to other drugs, medicaments and biological substances status: Secondary | ICD-10-CM

## 2024-08-29 DIAGNOSIS — Z9151 Personal history of suicidal behavior: Secondary | ICD-10-CM | POA: Insufficient documentation

## 2024-08-29 DIAGNOSIS — R45851 Suicidal ideations: Secondary | ICD-10-CM | POA: Diagnosis present

## 2024-08-29 DIAGNOSIS — T7611XA Adult physical abuse, suspected, initial encounter: Secondary | ICD-10-CM | POA: Insufficient documentation

## 2024-08-29 DIAGNOSIS — F411 Generalized anxiety disorder: Secondary | ICD-10-CM | POA: Diagnosis present

## 2024-08-29 DIAGNOSIS — Z62820 Parent-biological child conflict: Secondary | ICD-10-CM | POA: Insufficient documentation

## 2024-08-29 DIAGNOSIS — F909 Attention-deficit hyperactivity disorder, unspecified type: Secondary | ICD-10-CM | POA: Insufficient documentation

## 2024-08-29 DIAGNOSIS — Z604 Social exclusion and rejection: Secondary | ICD-10-CM | POA: Diagnosis present

## 2024-08-29 LAB — POCT URINE DRUG SCREEN - MANUAL ENTRY (I-SCREEN)
POC Amphetamine UR: NOT DETECTED
POC Buprenorphine (BUP): NOT DETECTED
POC Cocaine UR: NOT DETECTED
POC Marijuana UR: NOT DETECTED
POC Methadone UR: NOT DETECTED
POC Methamphetamine UR: NOT DETECTED
POC Morphine: NOT DETECTED
POC Oxazepam (BZO): NOT DETECTED
POC Oxycodone UR: NOT DETECTED
POC Secobarbital (BAR): NOT DETECTED

## 2024-08-29 LAB — COMPREHENSIVE METABOLIC PANEL WITH GFR
ALT: 38 U/L (ref 0–44)
AST: 26 U/L (ref 15–41)
Albumin: 4.6 g/dL (ref 3.5–5.0)
Alkaline Phosphatase: 74 U/L (ref 38–126)
Anion gap: 12 (ref 5–15)
BUN: 11 mg/dL (ref 6–20)
CO2: 27 mmol/L (ref 22–32)
Calcium: 9.7 mg/dL (ref 8.9–10.3)
Chloride: 97 mmol/L — ABNORMAL LOW (ref 98–111)
Creatinine, Ser: 0.74 mg/dL (ref 0.44–1.00)
GFR, Estimated: >60 mL/min
Glucose, Bld: 82 mg/dL (ref 70–99)
Potassium: 4.4 mmol/L (ref 3.5–5.1)
Sodium: 136 mmol/L (ref 135–145)
Total Bilirubin: 0.3 mg/dL (ref 0.0–1.2)
Total Protein: 7.8 g/dL (ref 6.5–8.1)

## 2024-08-29 LAB — CBC WITH DIFFERENTIAL/PLATELET
Abs Immature Granulocytes: 0.04 10*3/uL (ref 0.00–0.07)
Basophils Absolute: 0.1 10*3/uL (ref 0.0–0.1)
Basophils Relative: 1 %
Eosinophils Absolute: 0.2 10*3/uL (ref 0.0–0.5)
Eosinophils Relative: 2 %
HCT: 42.5 % (ref 36.0–46.0)
Hemoglobin: 14.6 g/dL (ref 12.0–15.0)
Immature Granulocytes: 1 %
Lymphocytes Relative: 26 %
Lymphs Abs: 2.3 10*3/uL (ref 0.7–4.0)
MCH: 33.4 pg (ref 26.0–34.0)
MCHC: 34.4 g/dL (ref 30.0–36.0)
MCV: 97.3 fL (ref 80.0–100.0)
Monocytes Absolute: 0.7 10*3/uL (ref 0.1–1.0)
Monocytes Relative: 8 %
Neutro Abs: 5.6 10*3/uL (ref 1.7–7.7)
Neutrophils Relative %: 62 %
Platelets: 316 10*3/uL (ref 150–400)
RBC: 4.37 MIL/uL (ref 3.87–5.11)
RDW: 12.8 % (ref 11.5–15.5)
WBC: 8.8 10*3/uL (ref 4.0–10.5)
nRBC: 0 % (ref 0.0–0.2)

## 2024-08-29 LAB — HEMOGLOBIN A1C
Hgb A1c MFr Bld: 5.9 % — ABNORMAL HIGH (ref 4.8–5.6)
Mean Plasma Glucose: 122.63 mg/dL

## 2024-08-29 LAB — ETHANOL: Alcohol, Ethyl (B): <15 mg/dL

## 2024-08-29 LAB — SARS CORONAVIRUS 2 BY RT PCR: SARS Coronavirus 2 by RT PCR: NEGATIVE

## 2024-08-29 LAB — VITAMIN D 25 HYDROXY (VIT D DEFICIENCY, FRACTURES): Vit D, 25-Hydroxy: 58.3 ng/mL (ref 30–100)

## 2024-08-29 MED ORDER — TRAZODONE HCL 50 MG PO TABS: 50.0000 mg | ORAL_TABLET | Freq: Every evening | ORAL | Status: DC | PRN

## 2024-08-29 MED ORDER — ACETAMINOPHEN 325 MG PO TABS: 650.0000 mg | ORAL_TABLET | Freq: Four times a day (QID) | ORAL | Status: DC | PRN

## 2024-08-29 MED ORDER — ALUM & MAG HYDROXIDE-SIMETH 200-200-20 MG/5ML PO SUSP: 30.0000 mL | ORAL | Status: DC | PRN

## 2024-08-29 MED ORDER — MAGNESIUM HYDROXIDE 400 MG/5ML PO SUSP: 30.0000 mL | Freq: Every day | ORAL | Status: DC | PRN

## 2024-08-29 MED ORDER — GUANFACINE HCL ER 2 MG PO TB24
2.0000 mg | ORAL_TABLET | Freq: Every day | ORAL | Status: DC
  Administered 2024-08-29: 2 mg via ORAL
  Filled 2024-08-29: qty 1

## 2024-08-29 MED ORDER — CLONAZEPAM 0.5 MG PO TABS: 0.5000 mg | ORAL_TABLET | Freq: Every day | ORAL | Status: DC | PRN

## 2024-08-29 MED ORDER — LAMOTRIGINE 150 MG PO TABS
150.0000 mg | ORAL_TABLET | Freq: Two times a day (BID) | ORAL | Status: DC
  Administered 2024-08-29: 150 mg via ORAL
  Filled 2024-08-29: qty 1

## 2024-08-29 MED ORDER — DULOXETINE HCL 30 MG PO CPEP: 30.0000 mg | ORAL_CAPSULE | Freq: Every day | ORAL | Status: DC

## 2024-08-29 MED ORDER — HALOPERIDOL 5 MG PO TABS: 5.0000 mg | ORAL_TABLET | Freq: Three times a day (TID) | ORAL | Status: DC | PRN

## 2024-08-29 MED ORDER — DIPHENHYDRAMINE HCL 50 MG/ML IJ SOLN: 50.0000 mg | Freq: Three times a day (TID) | INTRAMUSCULAR | Status: DC | PRN

## 2024-08-29 MED ORDER — PRIMIDONE 50 MG PO TABS
50.0000 mg | ORAL_TABLET | Freq: Two times a day (BID) | ORAL | Status: DC
  Administered 2024-08-29: 50 mg via ORAL
  Filled 2024-08-29: qty 1

## 2024-08-29 MED ORDER — LORAZEPAM 2 MG/ML IJ SOLN: 2.0000 mg | Freq: Three times a day (TID) | INTRAMUSCULAR | Status: DC | PRN

## 2024-08-29 MED ORDER — HYDROXYZINE HCL 25 MG PO TABS: 25.0000 mg | ORAL_TABLET | Freq: Three times a day (TID) | ORAL | Status: DC | PRN

## 2024-08-29 MED ORDER — HALOPERIDOL LACTATE 5 MG/ML IJ SOLN: 5.0000 mg | Freq: Three times a day (TID) | INTRAMUSCULAR | Status: DC | PRN

## 2024-08-29 MED ORDER — DIPHENHYDRAMINE HCL 50 MG PO CAPS: 50.0000 mg | ORAL_CAPSULE | Freq: Three times a day (TID) | ORAL | Status: DC | PRN

## 2024-08-29 MED ORDER — NICOTINE 21 MG/24HR TD PT24: 21.0000 mg | MEDICATED_PATCH | Freq: Every day | TRANSDERMAL | Status: DC

## 2024-08-29 MED ORDER — SPIRONOLACTONE 25 MG PO TABS: 25.0000 mg | ORAL_TABLET | Freq: Every day | ORAL | Status: DC

## 2024-08-29 MED ORDER — HALOPERIDOL LACTATE 5 MG/ML IJ SOLN: 10.0000 mg | Freq: Three times a day (TID) | INTRAMUSCULAR | Status: DC | PRN

## 2024-08-29 NOTE — ED Notes (Signed)
 [employer] Transport requested to [facility].  Pt is IVC.
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
## 2024-08-29 NOTE — ED Notes (Signed)
 Patient has been brought on unit, familiarized with unit, food has been provided, patient is sitting on recliner eating and talking with other patient. Patients respirations are even and unlabored, patient does not appear to be in any acute distress, will continue to monitor patient for safety.
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
## 2024-08-29 NOTE — ED Notes (Signed)
 Report called to RN Oscar, [facility] Pending COVID test.  Pt is IVC.
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
## 2024-08-30 ENCOUNTER — Other Ambulatory Visit: Payer: Self-pay

## 2024-08-30 ENCOUNTER — Encounter (HOSPITAL_COMMUNITY): Payer: Self-pay

## 2024-08-30 DIAGNOSIS — F332 Major depressive disorder, recurrent severe without psychotic features: Principal | ICD-10-CM | POA: Diagnosis present

## 2024-08-30 LAB — LAMOTRIGINE LEVEL: Lamotrigine Lvl: 9 ug/mL (ref 2.0–20.0)

## 2024-08-30 MED ORDER — LORAZEPAM 2 MG/ML IJ SOLN: 2.0000 mg | Freq: Three times a day (TID) | INTRAMUSCULAR | Status: DC | PRN

## 2024-08-30 MED ORDER — DIPHENHYDRAMINE HCL 50 MG/ML IJ SOLN: 50.0000 mg | Freq: Three times a day (TID) | INTRAMUSCULAR | Status: DC | PRN

## 2024-08-30 MED ORDER — PRIMIDONE 50 MG PO TABS
50.0000 mg | ORAL_TABLET | Freq: Two times a day (BID) | ORAL | Status: DC
  Administered 2024-08-30: 50 mg via ORAL
  Filled 2024-08-30 (×3): qty 1

## 2024-08-30 MED ORDER — HALOPERIDOL LACTATE 5 MG/ML IJ SOLN: 5.0000 mg | Freq: Three times a day (TID) | INTRAMUSCULAR | Status: DC | PRN

## 2024-08-30 MED ORDER — DIPHENHYDRAMINE HCL 25 MG PO CAPS: 50.0000 mg | ORAL_CAPSULE | Freq: Three times a day (TID) | ORAL | Status: DC | PRN

## 2024-08-30 MED ORDER — INFLUENZA VIRUS VACC SPLIT PF (FLUZONE) 0.5 ML IM SUSY
0.5000 mL | PREFILLED_SYRINGE | INTRAMUSCULAR | Status: DC
  Filled 2024-08-30: qty 0.5

## 2024-08-30 MED ORDER — SPIRONOLACTONE 25 MG PO TABS
25.0000 mg | ORAL_TABLET | Freq: Every day | ORAL | Status: DC
  Administered 2024-08-30: 25 mg via ORAL
  Filled 2024-08-30: qty 1

## 2024-08-30 MED ORDER — CLONAZEPAM 0.5 MG PO TABS: 0.5000 mg | ORAL_TABLET | Freq: Every day | ORAL | Status: DC | PRN

## 2024-08-30 MED ORDER — NICOTINE 14 MG/24HR TD PT24
14.0000 mg | MEDICATED_PATCH | Freq: Every day | TRANSDERMAL | Status: DC
  Administered 2024-08-30: 14 mg via TRANSDERMAL
  Filled 2024-08-30: qty 1

## 2024-08-30 MED ORDER — GUANFACINE HCL ER 2 MG PO TB24: 2.0000 mg | ORAL_TABLET | Freq: Every day | ORAL | Status: DC

## 2024-08-30 MED ORDER — HALOPERIDOL LACTATE 5 MG/ML IJ SOLN: 10.0000 mg | Freq: Three times a day (TID) | INTRAMUSCULAR | Status: DC | PRN

## 2024-08-30 MED ORDER — INFLUENZA VIRUS VACC SPLIT PF (FLUZONE) 0.5 ML IM SUSY
0.5000 mL | PREFILLED_SYRINGE | Freq: Once | INTRAMUSCULAR | Status: AC
  Administered 2024-08-30: 0.5 mL via INTRAMUSCULAR

## 2024-08-30 MED ORDER — DULOXETINE HCL 30 MG PO CPEP
30.0000 mg | ORAL_CAPSULE | Freq: Every day | ORAL | Status: DC
  Administered 2024-08-30: 30 mg via ORAL
  Filled 2024-08-30: qty 1

## 2024-08-30 MED ORDER — HALOPERIDOL 5 MG PO TABS: 5.0000 mg | ORAL_TABLET | Freq: Three times a day (TID) | ORAL | Status: DC | PRN

## 2024-08-30 MED ORDER — LAMOTRIGINE 150 MG PO TABS
150.0000 mg | ORAL_TABLET | Freq: Two times a day (BID) | ORAL | Status: DC
  Administered 2024-08-30: 150 mg via ORAL
  Filled 2024-08-30: qty 1

## 2024-08-30 NOTE — [facility] Group Notes (Signed)
 Patient did not attend Nutrition Group.
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
## 2024-08-30 NOTE — Tx Team (Signed)
 Initial Treatment Plan  [name] FMW:[id number]    PATIENT STRESSORS: Marital or family conflict   Traumatic event     PATIENT STRENGTHS: Active sense of humor  Average or above average intelligence  Communication skills  General fund of knowledge  Motivation for treatment/growth    PATIENT IDENTIFIED PROBLEMS: SI  HI  I want to work on getting out of here.                 DISCHARGE CRITERIA:  Improved stabilization in mood, thinking, and/or behavior Need for constant or close observation no longer present Reduction of life-threatening or endangering symptoms to within safe limits  PRELIMINARY DISCHARGE PLAN: Outpatient therapy Participate in family therapy Return to previous living arrangement Return to previous work or school arrangements  PATIENT/FAMILY INVOLVEMENT: This treatment plan has been presented to and reviewed with the patient, Carla Torres. The patient has been given the opportunity to ask questions and make suggestions.  Charolet Maxcy, RN

## 2024-08-30 NOTE — Transportation (Signed)
 08/30/2024  Carla Torres DOB: June 17, 1966 MRN: [id number]   RIDER WAIVER AND RELEASE OF LIABILITY  For the purposes of helping with transportation needs, [facility] partners with outside transportation providers (taxi companies, [employer], catering manager.) to give [facility] patients or other approved people the choice of on-demand rides Public Librarian) to our buildings for non-emergency visits.  By using [employer], I, the person signing this document, on behalf of myself and/or any legal minors (in my care using the [employer]), agree:  Science Writer given to me are supplied by independent, outside transportation providers who do not work for, or have any affiliation with, [employer]. [facility] is not a transportation company. [place] has no control over the quality or safety of the rides I get using [employer]. [facility] has no control over whether any outside ride will happen on time or not. [facility] gives no guarantee on the reliability, quality, safety, or availability on any rides, or that no mistakes will happen. I know and accept that traveling by vehicle (car, truck, SVU, fleeta, bus, taxi, etc.) has risks of serious injuries such as disability, being paralyzed, and death. I know and agree the risk of using [employer] is mine alone, and not [employer]. [employer] are provided as is and as are available. The transportation providers are in charge for all inspections and care of the vehicles used to provide these rides. I agree not to take legal action against [facility], its agents, employees, officers, directors, representatives, insurers, attorneys, assigns, successors, subsidiaries, and affiliates at any time for any reasons related directly or indirectly to using [employer]. I also agree not to take legal action against [facility] or its affiliates for any injury, death, or damage to property caused by or related to using  [employer]. I have read this Waiver and Release of Liability, and I understand the terms used in it and their legal meaning. This Waiver is freely and voluntarily given with the understanding that my right (or any legal minors) to legal action against  relating to [employer] is knowingly given up to use these services.   I attest that I read the Ride Waiver and Release of Liability to Carla Torres, gave Ms. Lauro the opportunity to ask questions and answered the questions asked (if any). I affirm that Carla Torres then provided consent for assistance with transportation.
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
## 2024-08-30 NOTE — BHH Group Notes (Signed)
 Patient did not attend Social Work Group.

## 2024-08-30 NOTE — Group Note (Signed)
 Date:  08/30/2024 Time:  9:55 AM  Group Topic/Focus: Goals Group Goals Group:   The focus of this group is to help patients establish daily goals to achieve during treatment and discuss how the patient can incorporate goal setting into their daily lives to aide in recovery.    Participation Level:  Active  Participation Quality:  Appropriate  Affect:  Appropriate  Cognitive:  Appropriate  Insight: Appropriate  Engagement in Group:  Engaged  Modes of Intervention:  Discussion  Additional Comments:  Patient attended and participated in Goals Group.  Rosaleen BIRCH Eain Mullendore 08/30/2024, 9:55 AM
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
## 2024-08-30 NOTE — B[facility] Suicide Risk Assessment (Signed)
 [facility] INPATIENT:  Family/Significant Other Suicide Prevention Education  Suicide Prevention Education:   Suicide Prevention Education was reviewed thoroughly with patient, including risk factors, warning signs, and what to do. Mobile Crisis services were described and that telephone number pointed out, with encouragement to patient to put this number in personal cell phone. Brochure was provided to patient to share with natural supports. Patient acknowledged the ways in which they are at risk, and how working through each of their issues can gradually start to reduce their risk factors. Patient was encouraged to think of the information in the context of people in their own lives. Patient denied having access to firearms Patient verbalized understanding of information provided. Patient endorsed a desire to live.

## 2024-08-30 NOTE — Group Note (Signed)
 LCSW Group Therapy Note   Group Date: 08/30/2024 Start Time: 1100 End Time: 1200   Participation:  did not attend  Type of Therapy:  Group Therapy   Topic:  Money Matters: Ecologist, Confidence and Peace of Mind  Objective: To help participants understand the impact of financial stability on well-being through the lens of Maslow's Hierarchy of Needs and develop practical strategies for budgeting, saving, and debt repayment.  Goals: Increase awareness of spending habits and financial priorities, recognizing how money supports basic needs, security, and relationships. Develop simple budgeting and saving strategies to enhance stability and peace of mind.  Reduce financial stress by creating a realistic debt repayment plan, supporting long-term confidence and well-being.  Summary:  Participants explored how financial stability connects to basic needs, relationships, and self-esteem using Maslow's Hierarchy. They discussed budgeting, saving, and debt repayment strategies, identifying small, manageable changes. Through interactive discussion and self-reflection, they gained insight into their financial habits and created personal action steps for improvement.  Therapeutic Modalities Used: Elements of Cognitive Behavioral Therapy (CBT) - Addressing financial stress and thought patterns. Psychoeducation - Engineer, agricultural. Elements of Motivational Interviewing (MI) - Encouraging realistic, achievable changes. Group Support - Reducing shame and stress through shared experiences.   Eddie Koc O Mithcell Schumpert, LCSWA 08/30/2024  12:58 PM

## 2024-08-30 NOTE — Progress Notes (Addendum)
" °  [facility] Adult Case Management Discharge Plan :  Will you be returning to the same living situation after discharge:  Yes,  pt returning home at discharge At discharge, do you have transportation home?: No. CSW arranged [employer] taxi back to address on file for 12:30pm (per MD, same day discharge) *pt confirmed she has house key with her Do you have the ability to pay for your medications: Yes,  pt has active health insurance coverage   Release of information consent forms completed and in the chart;  Patient's signature needed at discharge.  Patient to Follow up at:  Follow-up Information     [facility] Follow up on 09/06/2024.   Why: You have an appointment for therapy services assessment on 09/06/24 at 11:00 am, Virtual.  You also have an appointment for medication management services on 09/11/24 at 1:00 pm, Virtual. * The provider will send you some forms to establish care for therapy services via your patient portal. Contact information: [address], [place], [place] [address] Phone: [phone]                Next level of care provider has access to [facility] Link:no  Safety Planning and Suicide Prevention discussed: Yes,  completed with patient at discharge, declined other consents     Has patient been referred to the Quitline?: Patient refused referral for treatment  Patient has been referred for addiction treatment: No known substance use disorder.  Jenkins LULLA Primer, LCSWA 08/30/2024, 11:33 AM "

## 2024-08-30 NOTE — Plan of Care (Signed)

## 2024-08-30 NOTE — Progress Notes (Signed)
 Pt discharged to taxi. Pt was stable and appreciative at that time. All papers and prescriptions were given and valuables returned. Verbal understanding expressed. Denies SI/HI and A/VH. Pt given opportunity to express concerns and ask questions.

## 2024-08-30 NOTE — Progress Notes (Signed)
 Patient denies SI, HI, AVH. Patient stated they slept Good last night. Scored 0/10 on anxiety and depression. Patient has been calm, cooperative, and med compliant.   08/30/24 1100  Psych Admission Type (Psych Patients Only)  Admission Status Involuntary  Psychosocial Assessment  Patient Complaints None  Eye Contact Fair  Facial Expression Animated  Affect Appropriate to circumstance  Speech Logical/coherent  Interaction Assertive  Motor Activity Other (Comment) (WDL)  Appearance/Hygiene Unremarkable  Behavior Characteristics Cooperative  Mood Pleasant  Thought Process  Coherency WDL  Content WDL  Delusions None reported or observed  Perception WDL  Hallucination None reported or observed  Judgment Poor  Confusion None  Danger to Self  Current suicidal ideation? Denies  Danger to Others  Danger to Others None reported or observed
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
